# Patient Record
Sex: Male | Born: 1980 | Race: Black or African American | Hispanic: No | Marital: Married | Smoking: Former smoker
Health system: Southern US, Community
[De-identification: ages and names within clinical notes are randomized; demographics above are authoritative.]

## PROBLEM LIST (undated history)

## (undated) DIAGNOSIS — R45851 Suicidal ideations: Secondary | ICD-10-CM

## (undated) DIAGNOSIS — K8020 Calculus of gallbladder without cholecystitis without obstruction: Secondary | ICD-10-CM

## (undated) DIAGNOSIS — M549 Dorsalgia, unspecified: Secondary | ICD-10-CM

## (undated) DIAGNOSIS — F329 Major depressive disorder, single episode, unspecified: Secondary | ICD-10-CM

## (undated) DIAGNOSIS — S0990XA Unspecified injury of head, initial encounter: Secondary | ICD-10-CM

## (undated) DIAGNOSIS — F32A Depression, unspecified: Secondary | ICD-10-CM

## (undated) DIAGNOSIS — M542 Cervicalgia: Secondary | ICD-10-CM

## (undated) HISTORY — PX: WISDOM TOOTH EXTRACTION: SHX21

## (undated) HISTORY — DX: Unspecified injury of head, initial encounter: S09.90XA

## (undated) HISTORY — DX: Cervicalgia: M54.2

---

## 2000-11-20 ENCOUNTER — Emergency Department (HOSPITAL_COMMUNITY): Admission: EM | Admit: 2000-11-20 | Discharge: 2000-11-20 | Payer: Self-pay | Admitting: Emergency Medicine

## 2002-01-17 ENCOUNTER — Emergency Department (HOSPITAL_COMMUNITY): Admission: EM | Admit: 2002-01-17 | Discharge: 2002-01-17 | Payer: Self-pay | Admitting: Emergency Medicine

## 2002-02-14 ENCOUNTER — Emergency Department (HOSPITAL_COMMUNITY): Admission: EM | Admit: 2002-02-14 | Discharge: 2002-02-15 | Payer: Self-pay | Admitting: Emergency Medicine

## 2002-02-15 ENCOUNTER — Encounter: Payer: Self-pay | Admitting: Emergency Medicine

## 2002-07-02 ENCOUNTER — Emergency Department (HOSPITAL_COMMUNITY): Admission: EM | Admit: 2002-07-02 | Discharge: 2002-07-02 | Payer: Self-pay | Admitting: Emergency Medicine

## 2002-07-02 ENCOUNTER — Encounter: Payer: Self-pay | Admitting: Emergency Medicine

## 2003-05-10 ENCOUNTER — Emergency Department (HOSPITAL_COMMUNITY): Admission: EM | Admit: 2003-05-10 | Discharge: 2003-05-10 | Payer: Self-pay | Admitting: Emergency Medicine

## 2003-09-03 ENCOUNTER — Emergency Department (HOSPITAL_COMMUNITY): Admission: EM | Admit: 2003-09-03 | Discharge: 2003-09-03 | Payer: Self-pay | Admitting: Emergency Medicine

## 2003-12-28 ENCOUNTER — Emergency Department (HOSPITAL_COMMUNITY): Admission: EM | Admit: 2003-12-28 | Discharge: 2003-12-28 | Payer: Self-pay | Admitting: Emergency Medicine

## 2004-10-02 ENCOUNTER — Emergency Department (HOSPITAL_COMMUNITY): Admission: EM | Admit: 2004-10-02 | Discharge: 2004-10-03 | Payer: Self-pay | Admitting: Emergency Medicine

## 2009-02-21 ENCOUNTER — Emergency Department (HOSPITAL_COMMUNITY): Admission: EM | Admit: 2009-02-21 | Discharge: 2009-02-21 | Payer: Self-pay | Admitting: Emergency Medicine

## 2009-11-09 ENCOUNTER — Inpatient Hospital Stay (HOSPITAL_COMMUNITY): Admission: EM | Admit: 2009-11-09 | Discharge: 2009-11-23 | Payer: Self-pay | Admitting: Emergency Medicine

## 2009-11-12 ENCOUNTER — Ambulatory Visit: Payer: Self-pay | Admitting: Physical Medicine & Rehabilitation

## 2009-12-26 ENCOUNTER — Encounter: Admission: RE | Admit: 2009-12-26 | Discharge: 2009-12-26 | Payer: Self-pay | Admitting: Neurology

## 2010-01-09 ENCOUNTER — Encounter: Admission: RE | Admit: 2010-01-09 | Discharge: 2010-01-09 | Payer: Self-pay | Admitting: Neurology

## 2010-02-20 ENCOUNTER — Encounter: Admission: RE | Admit: 2010-02-20 | Discharge: 2010-04-15 | Payer: Self-pay | Admitting: Neurology

## 2010-03-13 ENCOUNTER — Encounter
Admission: RE | Admit: 2010-03-13 | Discharge: 2010-06-11 | Payer: Self-pay | Admitting: Physical Medicine & Rehabilitation

## 2010-03-19 ENCOUNTER — Ambulatory Visit: Payer: Self-pay | Admitting: Physical Medicine & Rehabilitation

## 2010-04-22 ENCOUNTER — Ambulatory Visit: Payer: Self-pay | Admitting: Physical Medicine & Rehabilitation

## 2010-05-23 ENCOUNTER — Ambulatory Visit: Payer: Self-pay | Admitting: Physical Medicine & Rehabilitation

## 2010-07-12 ENCOUNTER — Encounter
Admission: RE | Admit: 2010-07-12 | Discharge: 2010-09-24 | Payer: Self-pay | Source: Home / Self Care | Attending: Physical Medicine & Rehabilitation | Admitting: Physical Medicine & Rehabilitation

## 2010-09-15 ENCOUNTER — Encounter: Payer: Self-pay | Admitting: Emergency Medicine

## 2010-09-30 ENCOUNTER — Observation Stay (HOSPITAL_COMMUNITY)
Admission: EM | Admit: 2010-09-30 | Discharge: 2010-10-01 | Disposition: A | Discharge status: Home / Self Care | Payer: Self-pay | Attending: Emergency Medicine | Admitting: Emergency Medicine

## 2010-09-30 ENCOUNTER — Observation Stay (HOSPITAL_COMMUNITY): Payer: Self-pay

## 2010-09-30 ENCOUNTER — Emergency Department (HOSPITAL_COMMUNITY): Payer: Self-pay

## 2010-09-30 DIAGNOSIS — R197 Diarrhea, unspecified: Secondary | ICD-10-CM | POA: Insufficient documentation

## 2010-09-30 DIAGNOSIS — N4403 Torsion of appendix testis: Secondary | ICD-10-CM | POA: Insufficient documentation

## 2010-09-30 DIAGNOSIS — R109 Unspecified abdominal pain: Secondary | ICD-10-CM | POA: Insufficient documentation

## 2010-09-30 DIAGNOSIS — K5289 Other specified noninfective gastroenteritis and colitis: Principal | ICD-10-CM | POA: Insufficient documentation

## 2010-09-30 DIAGNOSIS — R112 Nausea with vomiting, unspecified: Secondary | ICD-10-CM | POA: Insufficient documentation

## 2010-09-30 DIAGNOSIS — R82998 Other abnormal findings in urine: Secondary | ICD-10-CM | POA: Insufficient documentation

## 2010-09-30 LAB — GLUCOSE, CAPILLARY: Glucose-Capillary: 117 mg/dL — ABNORMAL HIGH (ref 70–99)

## 2010-09-30 LAB — CBC
HCT: 45.9 % (ref 39.0–52.0)
MCHC: 34.4 g/dL (ref 30.0–36.0)
Platelets: 234 10*3/uL (ref 150–400)

## 2010-09-30 LAB — RAPID URINE DRUG SCREEN, HOSP PERFORMED
Amphetamines: NOT DETECTED
Benzodiazepines: NOT DETECTED
Opiates: POSITIVE — AB

## 2010-09-30 LAB — COMPREHENSIVE METABOLIC PANEL
ALT: 22 U/L (ref 0–53)
Albumin: 4.1 g/dL (ref 3.5–5.2)
BUN: 13 mg/dL (ref 6–23)
CO2: 21 mEq/L (ref 19–32)
Calcium: 9.5 mg/dL (ref 8.4–10.5)
Chloride: 107 mEq/L (ref 96–112)
Creatinine, Ser: 1.25 mg/dL (ref 0.4–1.5)
GFR calc Af Amer: >60 mL/min (ref >60)
GFR calc non Af Amer: >60 mL/min (ref >60)
Potassium: 4.5 mEq/L (ref 3.5–5.1)
Sodium: 142 mEq/L (ref 135–145)
Total Protein: 7.8 g/dL (ref 6.0–8.3)

## 2010-09-30 LAB — CLOSTRIDIUM DIFFICILE BY PCR: Toxigenic C. Difficile by PCR: NEGATIVE

## 2010-09-30 LAB — URINALYSIS, ROUTINE W REFLEX MICROSCOPIC
Specific Gravity, Urine: 1.026 (ref 1.005–1.030)
Urobilinogen, UA: 1 mg/dL (ref 0.0–1.0)

## 2010-09-30 LAB — URINE MICROSCOPIC-ADD ON

## 2010-10-01 ENCOUNTER — Encounter (HOSPITAL_COMMUNITY): Payer: Self-pay | Admitting: Radiology

## 2010-10-01 LAB — GIARDIA/CRYPTOSPORIDIUM SCREEN(EIA): Giardia Screen - EIA: NEGATIVE

## 2010-10-01 MED ORDER — IOHEXOL 300 MG/ML  SOLN: 100.0000 mL | Freq: Once | INTRAMUSCULAR | Status: DC | PRN

## 2010-10-04 LAB — STOOL CULTURE

## 2010-10-18 ENCOUNTER — Emergency Department (HOSPITAL_COMMUNITY)
Admission: EM | Admit: 2010-10-18 | Discharge: 2010-10-18 | Disposition: A | Discharge status: Home / Self Care | Payer: Self-pay | Attending: Emergency Medicine | Admitting: Emergency Medicine

## 2010-10-18 ENCOUNTER — Emergency Department (HOSPITAL_COMMUNITY): Payer: Self-pay

## 2010-10-18 DIAGNOSIS — R079 Chest pain, unspecified: Secondary | ICD-10-CM | POA: Insufficient documentation

## 2010-10-18 DIAGNOSIS — R0602 Shortness of breath: Secondary | ICD-10-CM | POA: Insufficient documentation

## 2010-10-18 DIAGNOSIS — R51 Headache: Secondary | ICD-10-CM | POA: Insufficient documentation

## 2010-10-18 DIAGNOSIS — R509 Fever, unspecified: Secondary | ICD-10-CM | POA: Insufficient documentation

## 2010-10-18 DIAGNOSIS — Y929 Unspecified place or not applicable: Secondary | ICD-10-CM | POA: Insufficient documentation

## 2010-10-18 DIAGNOSIS — T391X1A Poisoning by 4-Aminophenol derivatives, accidental (unintentional), initial encounter: Secondary | ICD-10-CM | POA: Insufficient documentation

## 2010-10-18 DIAGNOSIS — R5381 Other malaise: Secondary | ICD-10-CM | POA: Insufficient documentation

## 2010-10-18 LAB — CBC
HCT: 44.2 % (ref 39.0–52.0)
Hemoglobin: 15.1 g/dL (ref 13.0–17.0)
MCH: 28.9 pg (ref 26.0–34.0)
MCV: 84.7 fL (ref 78.0–100.0)
RBC: 5.22 MIL/uL (ref 4.22–5.81)

## 2010-10-18 LAB — RAPID URINE DRUG SCREEN, HOSP PERFORMED
Amphetamines: NOT DETECTED
Barbiturates: NOT DETECTED
Benzodiazepines: NOT DETECTED
Opiates: POSITIVE — AB

## 2010-10-18 LAB — POCT I-STAT 3, ART BLOOD GAS (G3+)
Bicarbonate: 23.5 mEq/L (ref 20.0–24.0)
O2 Saturation: 95 %
TCO2: 25 mmol/L (ref 0–100)
pCO2 arterial: 37.4 mmHg (ref 35.0–45.0)

## 2010-10-18 LAB — URINE MICROSCOPIC-ADD ON

## 2010-10-18 LAB — COMPREHENSIVE METABOLIC PANEL
ALT: 25 U/L (ref 0–53)
AST: 22 U/L (ref 0–37)
Calcium: 9.3 mg/dL (ref 8.4–10.5)
Creatinine, Ser: 0.97 mg/dL (ref 0.4–1.5)
GFR calc Af Amer: >60 mL/min (ref >60)
GFR calc non Af Amer: >60 mL/min (ref >60)
Sodium: 137 mEq/L (ref 135–145)
Total Protein: 7.6 g/dL (ref 6.0–8.3)

## 2010-10-18 LAB — DIFFERENTIAL
Eosinophils Absolute: 0.1 10*3/uL (ref 0.0–0.7)
Lymphocytes Relative: 6 % — ABNORMAL LOW (ref 12–46)
Lymphs Abs: 0.5 10*3/uL — ABNORMAL LOW (ref 0.7–4.0)
Monocytes Relative: 12 % (ref 3–12)
Neutro Abs: 6.8 10*3/uL (ref 1.7–7.7)
Neutrophils Relative %: 80 % — ABNORMAL HIGH (ref 43–77)

## 2010-10-18 LAB — URINALYSIS, ROUTINE W REFLEX MICROSCOPIC
Bilirubin Urine: NEGATIVE
Hgb urine dipstick: NEGATIVE
Ketones, ur: 15 mg/dL — AB
Specific Gravity, Urine: 1.021 (ref 1.005–1.030)
Urine Glucose, Fasting: NEGATIVE mg/dL
pH: 7.5 (ref 5.0–8.0)

## 2010-10-18 LAB — LACTIC ACID, PLASMA: Lactic Acid, Venous: 1.4 mmol/L (ref 0.5–2.2)

## 2010-10-18 LAB — ETHANOL: Alcohol, Ethyl (B): <5 mg/dL (ref 0–10)

## 2010-10-24 LAB — CULTURE, BLOOD (ROUTINE X 2): Culture  Setup Time: 201202241106

## 2010-11-18 LAB — HTLV I+II ANTIBODIES, (EIA), BLD: HTLV I/II Ab: NONREACTIVE

## 2010-11-18 LAB — DIFFERENTIAL
Eosinophils Absolute: 0.2 10*3/uL (ref 0.0–0.7)
Eosinophils Relative: 4 % (ref 0–5)
Lymphocytes Relative: 32 % (ref 12–46)
Lymphs Abs: 2 10*3/uL (ref 0.7–4.0)
Monocytes Absolute: 0.5 10*3/uL (ref 0.1–1.0)
Monocytes Relative: 8 % (ref 3–12)

## 2010-11-18 LAB — HSV PCR
HSV 2 , PCR: NOT DETECTED
HSV 2 , PCR: NOT DETECTED
HSV, PCR: NOT DETECTED
HSV, PCR: NOT DETECTED
Specimen Source-HSVPCR: NO GROWTH

## 2010-11-18 LAB — RPR: RPR Ser Ql: NONREACTIVE

## 2010-11-18 LAB — COMPREHENSIVE METABOLIC PANEL
ALT: 54 U/L — ABNORMAL HIGH (ref 0–53)
AST: 37 U/L (ref 0–37)
Albumin: 3.6 g/dL (ref 3.5–5.2)
CO2: 28 mEq/L (ref 19–32)
Calcium: 9.1 mg/dL (ref 8.4–10.5)
Chloride: 103 mEq/L (ref 96–112)
GFR calc Af Amer: >60 mL/min (ref >60)
GFR calc non Af Amer: >60 mL/min (ref >60)
Sodium: 137 mEq/L (ref 135–145)

## 2010-11-18 LAB — CSF CELL COUNT WITH DIFFERENTIAL
Lymphs, CSF: 97 % — ABNORMAL HIGH (ref 40–80)
Lymphs, CSF: 98 % — ABNORMAL HIGH (ref 40–80)
Monocyte-Macrophage-Spinal Fluid: 2 % — ABNORMAL LOW (ref 15–45)
Monocyte-Macrophage-Spinal Fluid: 3 % — ABNORMAL LOW (ref 15–45)

## 2010-11-18 LAB — ANGIOTENSIN CONVERTING ENZYME: Angiotensin-Converting Enzyme: 18 U/L (ref 9–67)

## 2010-11-18 LAB — AFB CULTURE WITH SMEAR (NOT AT ARMC)

## 2010-11-18 LAB — HIV ANTIBODY (ROUTINE TESTING W REFLEX): HIV: NONREACTIVE

## 2010-11-18 LAB — CBC
MCHC: 33.4 g/dL (ref 30.0–36.0)
Platelets: 213 10*3/uL (ref 150–400)
RBC: 5.05 MIL/uL (ref 4.22–5.81)
WBC: 6.2 10*3/uL (ref 4.0–10.5)

## 2010-11-18 LAB — EPSTEIN BARR VRS(EBV DNA BY PCR)
EBV DNA QN by PCR: <200 copies/mL (ref <200)
EBV DNA QN by PCR: <200 copies/mL (ref <200)

## 2010-11-18 LAB — PROTEIN AND GLUCOSE, CSF
Glucose, CSF: 53 mg/dL (ref 43–76)
Total  Protein, CSF: 74 mg/dL — ABNORMAL HIGH (ref 15–45)

## 2010-11-18 LAB — OLIGOCLONAL BANDS, CSF + SERM

## 2010-11-18 LAB — CSF CULTURE W GRAM STAIN

## 2010-11-18 LAB — ARBOVIRUS PANEL, ~~LOC~~ LAB

## 2011-07-15 ENCOUNTER — Ambulatory Visit: Payer: Self-pay | Admitting: Physical Medicine & Rehabilitation

## 2011-07-15 ENCOUNTER — Encounter: Payer: 59 | Attending: Physical Medicine & Rehabilitation

## 2011-07-15 DIAGNOSIS — F329 Major depressive disorder, single episode, unspecified: Secondary | ICD-10-CM | POA: Insufficient documentation

## 2011-07-15 DIAGNOSIS — R279 Unspecified lack of coordination: Secondary | ICD-10-CM | POA: Insufficient documentation

## 2011-07-15 DIAGNOSIS — M62838 Other muscle spasm: Secondary | ICD-10-CM | POA: Insufficient documentation

## 2011-07-15 DIAGNOSIS — R42 Dizziness and giddiness: Secondary | ICD-10-CM | POA: Insufficient documentation

## 2011-07-15 DIAGNOSIS — M47817 Spondylosis without myelopathy or radiculopathy, lumbosacral region: Secondary | ICD-10-CM | POA: Insufficient documentation

## 2011-07-15 DIAGNOSIS — M79609 Pain in unspecified limb: Secondary | ICD-10-CM | POA: Insufficient documentation

## 2011-07-15 DIAGNOSIS — IMO0002 Reserved for concepts with insufficient information to code with codable children: Secondary | ICD-10-CM | POA: Insufficient documentation

## 2011-07-15 DIAGNOSIS — M545 Low back pain, unspecified: Secondary | ICD-10-CM | POA: Insufficient documentation

## 2011-07-15 DIAGNOSIS — M75100 Unspecified rotator cuff tear or rupture of unspecified shoulder, not specified as traumatic: Secondary | ICD-10-CM | POA: Insufficient documentation

## 2011-07-15 DIAGNOSIS — F411 Generalized anxiety disorder: Secondary | ICD-10-CM | POA: Insufficient documentation

## 2011-07-15 DIAGNOSIS — R109 Unspecified abdominal pain: Secondary | ICD-10-CM | POA: Insufficient documentation

## 2011-07-15 DIAGNOSIS — M25519 Pain in unspecified shoulder: Secondary | ICD-10-CM | POA: Insufficient documentation

## 2011-07-15 DIAGNOSIS — F3289 Other specified depressive episodes: Secondary | ICD-10-CM | POA: Insufficient documentation

## 2011-07-15 DIAGNOSIS — M719 Bursopathy, unspecified: Secondary | ICD-10-CM

## 2011-07-15 DIAGNOSIS — R110 Nausea: Secondary | ICD-10-CM | POA: Insufficient documentation

## 2011-07-15 DIAGNOSIS — M67919 Unspecified disorder of synovium and tendon, unspecified shoulder: Secondary | ICD-10-CM

## 2011-07-15 DIAGNOSIS — R209 Unspecified disturbances of skin sensation: Secondary | ICD-10-CM | POA: Insufficient documentation

## 2011-07-15 NOTE — Assessment & Plan Note (Signed)
HISTORY:  A 30 year old male, lifting injury November 09, 2009, moving boxes for his wife in a [employer], onset of pain radiating to the lower extremity.  He no longer has lower extremity radiating pain.  He has been seen by Psychiatry in the past for depressive disorder.  In regards to his back, a repeat MRI showed no significant nerve root encroachment.  Some bulging L4-5 disk.  He was trialed with medial branch blocks, performed April 22, 2010 left side at L2, 3, 4 levels. This was repeated May 23, 2010 at same levels, left L2, 3, 4.  He once again got good relief, 80% plus last couple months.  The patient then lost his insurance.  He has insurance again.  Pain level about 8/10.  He also has some shoulder pain.  He wants me to evaluate.  He has some pain that radiates upward from his low back.  He can walk 20-30 minutes at a time.  He is able to climb steps.  He is able to drive.  He works 40 hours a week at [employer].  REVIEW OF SYSTEMS:  Numbness, tingling.  She states are in his hands at times.  Trouble walking, spasms, dizziness, depression, anxiety, abdominal pain, nausea.  FAMILY HISTORY:  Diabetes.  SOCIAL HISTORY:  Married.  Alcohol use on weekends, smokes about 4 cigarettes per day.  OBJECTIVE:  VITAL SIGNS:  Blood pressure 163/91, pulse 85, respirations 18 and O2 saturation 95% room air.  GENERAL:  No acute distress.  Mood and affect appropriate. BACK:  His back has tenderness to palpation along the lumbar paraspinals on the left side mainly his pain is more when he leans left or on his backward more so than leaning forward.  Straight leg raising test is negative.  His right shoulder has positive impingement sign.  Neck has full range of motion without pain.  Upper extremity and lower extremity strength are normal.  IMPRESSION: 1. Lumbar spondylosis.  I think he would be a good candidate for     radiofrequency neurotomy given he has had a positive response  x2     with medial branch blocks corresponding levels. 2. Right shoulder pain subacromial bursitis.  He has no obvious risk     factors.  We discussed his activities.  Does not do a lot of     overhead activity.  Nevertheless, we will start him on Mobic for     about 10-14 days, 50 mg. 3. Reinstitute tramadol and he says that it was helpful for him. 4. This will likely need to go through some physical therapy after we     finish with his radiofrequency working out only on his back, but     also in his shoulder and neck area, upper back area.  I discussed     with the patient and wife, agree with plan.     Erick Colace, M.D. Electronically Signed    AEK/MedQ D:  07/15/2011 16:50:28  T:  07/15/2011 21:29:17  Job #:  [id number]

## 2011-08-11 ENCOUNTER — Encounter: Payer: 59 | Admitting: Physical Medicine & Rehabilitation

## 2011-08-11 ENCOUNTER — Encounter: Payer: 59 | Attending: Physical Medicine & Rehabilitation

## 2011-08-11 DIAGNOSIS — M47817 Spondylosis without myelopathy or radiculopathy, lumbosacral region: Secondary | ICD-10-CM

## 2011-08-11 DIAGNOSIS — M545 Low back pain, unspecified: Secondary | ICD-10-CM | POA: Insufficient documentation

## 2011-08-11 DIAGNOSIS — R279 Unspecified lack of coordination: Secondary | ICD-10-CM | POA: Insufficient documentation

## 2011-08-11 DIAGNOSIS — IMO0002 Reserved for concepts with insufficient information to code with codable children: Secondary | ICD-10-CM | POA: Insufficient documentation

## 2011-08-11 DIAGNOSIS — F3289 Other specified depressive episodes: Secondary | ICD-10-CM | POA: Insufficient documentation

## 2011-08-11 DIAGNOSIS — R42 Dizziness and giddiness: Secondary | ICD-10-CM | POA: Insufficient documentation

## 2011-08-11 DIAGNOSIS — R110 Nausea: Secondary | ICD-10-CM | POA: Insufficient documentation

## 2011-08-11 DIAGNOSIS — R109 Unspecified abdominal pain: Secondary | ICD-10-CM | POA: Insufficient documentation

## 2011-08-11 DIAGNOSIS — M75100 Unspecified rotator cuff tear or rupture of unspecified shoulder, not specified as traumatic: Secondary | ICD-10-CM | POA: Insufficient documentation

## 2011-08-11 DIAGNOSIS — R209 Unspecified disturbances of skin sensation: Secondary | ICD-10-CM | POA: Insufficient documentation

## 2011-08-11 DIAGNOSIS — F411 Generalized anxiety disorder: Secondary | ICD-10-CM | POA: Insufficient documentation

## 2011-08-11 DIAGNOSIS — M62838 Other muscle spasm: Secondary | ICD-10-CM | POA: Insufficient documentation

## 2011-08-11 DIAGNOSIS — F329 Major depressive disorder, single episode, unspecified: Secondary | ICD-10-CM | POA: Insufficient documentation

## 2011-08-11 DIAGNOSIS — M25519 Pain in unspecified shoulder: Secondary | ICD-10-CM | POA: Insufficient documentation

## 2011-08-11 DIAGNOSIS — M79609 Pain in unspecified limb: Secondary | ICD-10-CM | POA: Insufficient documentation

## 2011-08-11 NOTE — Procedures (Signed)
Mark Rice, Mark Rice              ACCOUNT NO.:  [id number]  MEDICAL RECORD NO.:  [id number]           PATIENT TYPE:  O  LOCATION:  [facility]                          FACILITY:  [facility]  PHYSICIAN:  Erick Colace, M.D.DATE OF BIRTH:  1980/12/19  DATE OF PROCEDURE:  08/11/2011 DATE OF DISCHARGE:                              OPERATIVE REPORT  PROCEDURE:  Left L2, left L3, left L4 medial branch radiofrequency neurotomy.  INDICATION:  Lumbar pain that responded at a 75% level to medial branch blocks x2 at corresponding levels.  Pain is only partially responsive to medication management and other conservative care.  Informed consent was obtained after describing risks and benefits of the procedure with the patient.  These include bleeding, bruising, and infection.  He elects to proceed and has given written consent.  The patient placed prone on fluoroscopy table.  Betadine prep, sterile drape, 25-gauge inch and half needle was used to anesthetize the skin and subcutaneous tissue with 1% lidocaine x2 mL.  Then, an 18-gauge 15 cm RF needle was inserted under fluoroscopic guidance first charting left L5 SAP transverse process junction, bone contact made, confirmed with lateral imaging.  Sensory stim at 50 Hz followed by motor stim at 2 Hz, confirmed proper needle location, followed by injection of 1 mL dexamethasone lidocaine solution which consists of 1 mL of 4 mg/mL dexamethasone, 2 mL of 1% lidocaine followed by radiofrequency lesioning 70 degrees Celsius for 70 seconds.  Then, the left L4 SAP transverse process junction targeted, bone contact made confirmed with lateral imaging.  Sensory stim at 50 Hz followed by motor stim at 2 Hz, confirmed proper needle location followed by injection of 1 mL of dexamethasone lidocaine solution and radiofrequency lesioning 70 degrees Celsius for 70 seconds.  Then, the left L3 SAP transverse process junction targeted, bone contact made, confirmed with  lateral imaging. Sensory stim at 50 Hz followed by motor stim at 2 Hz confirmed proper needle location followed by injection of 1 mL of the dexamethasone lidocaine solution and radiofrequency lesioning 70 degrees Celsius for 70 seconds.  The patient tolerated procedure well.  Pre and post injection vitals stable.  Post injection instructions given.     Erick Colace, M.D. Electronically Signed    AEK/MEDQ  D:  08/11/2011 14:48:34  T:  08/11/2011 19:48:32  Job:  [id number]

## 2011-09-11 ENCOUNTER — Encounter: Payer: 59 | Attending: Physical Medicine & Rehabilitation

## 2011-09-11 ENCOUNTER — Ambulatory Visit: Payer: 59 | Admitting: Physical Medicine & Rehabilitation

## 2011-09-11 DIAGNOSIS — IMO0002 Reserved for concepts with insufficient information to code with codable children: Secondary | ICD-10-CM | POA: Insufficient documentation

## 2011-09-11 DIAGNOSIS — M47817 Spondylosis without myelopathy or radiculopathy, lumbosacral region: Secondary | ICD-10-CM

## 2011-09-11 DIAGNOSIS — R42 Dizziness and giddiness: Secondary | ICD-10-CM | POA: Insufficient documentation

## 2011-09-11 DIAGNOSIS — M545 Low back pain, unspecified: Secondary | ICD-10-CM | POA: Insufficient documentation

## 2011-09-11 DIAGNOSIS — F329 Major depressive disorder, single episode, unspecified: Secondary | ICD-10-CM | POA: Insufficient documentation

## 2011-09-11 DIAGNOSIS — R209 Unspecified disturbances of skin sensation: Secondary | ICD-10-CM | POA: Insufficient documentation

## 2011-09-11 DIAGNOSIS — R110 Nausea: Secondary | ICD-10-CM | POA: Insufficient documentation

## 2011-09-11 DIAGNOSIS — M62838 Other muscle spasm: Secondary | ICD-10-CM | POA: Insufficient documentation

## 2011-09-11 DIAGNOSIS — R279 Unspecified lack of coordination: Secondary | ICD-10-CM | POA: Insufficient documentation

## 2011-09-11 DIAGNOSIS — M75100 Unspecified rotator cuff tear or rupture of unspecified shoulder, not specified as traumatic: Secondary | ICD-10-CM | POA: Insufficient documentation

## 2011-09-11 DIAGNOSIS — F3289 Other specified depressive episodes: Secondary | ICD-10-CM | POA: Insufficient documentation

## 2011-09-11 DIAGNOSIS — M25519 Pain in unspecified shoulder: Secondary | ICD-10-CM | POA: Insufficient documentation

## 2011-09-11 DIAGNOSIS — R109 Unspecified abdominal pain: Secondary | ICD-10-CM | POA: Insufficient documentation

## 2011-09-11 DIAGNOSIS — F411 Generalized anxiety disorder: Secondary | ICD-10-CM | POA: Insufficient documentation

## 2011-09-11 DIAGNOSIS — M79609 Pain in unspecified limb: Secondary | ICD-10-CM | POA: Insufficient documentation

## 2011-09-11 NOTE — Assessment & Plan Note (Signed)
A 31 year old male who underwent left L2, L3, L4 medial branch radiofrequency neurotomy under fluoroscopic guidance performed on August 11, 2011.  He states that his pain on that left side is basically gone.  He only feels pain on the right side.  He rates his right-sided pain at 6-7/10.  Described as sharp, stabbing and aching. Worsened with walking, bending, sitting and standing.  Interfering with sleep, improving with TENS, injections and medication.  However, still causing some problems with his functioning and his Oswestry score is 48.  REVIEW OF SYSTEMS:  Otherwise negative.  PHYSICAL EXAMINATION:  GENERAL:  No acute distress. Mood and affect are appropriate. VITAL SIGNS:  Blood pressure 134/77, pulse 78, respiratory rate is 18 and O2 sat 92% on room air.  Weight 371 pounds, height 5 feet 11 inches. EXTREMITIES:  He has no tenderness to palpation in his lumbar spine.  No evidence of redness in the skin.  His forward flexion and extension are good.  His lateral bending toward the left was normal, to the right was painful.  His gait was normal.  IMPRESSION:  Lumbar spondylosis mainly L2-3, L3-4 facet joint complex.  PLAN:  We will do the radiofrequency procedure targeting medial branches L2, 3, 4.  He has had bilateral medial branch blocks performed in the past which were helpful.  Discussed with the patient, agrees with plan.     Erick Colace, M.D. Electronically Signed    AEK/MedQ D:  09/11/2011 10:32:46  T:  09/11/2011 17:39:03  Job #:  [id number]

## 2011-10-02 ENCOUNTER — Encounter: Payer: 59 | Attending: Physical Medicine & Rehabilitation

## 2011-10-02 ENCOUNTER — Encounter: Payer: 59 | Admitting: Physical Medicine & Rehabilitation

## 2011-10-02 DIAGNOSIS — M545 Low back pain, unspecified: Secondary | ICD-10-CM | POA: Insufficient documentation

## 2011-10-02 DIAGNOSIS — R279 Unspecified lack of coordination: Secondary | ICD-10-CM | POA: Insufficient documentation

## 2011-10-02 DIAGNOSIS — IMO0002 Reserved for concepts with insufficient information to code with codable children: Secondary | ICD-10-CM | POA: Insufficient documentation

## 2011-10-02 DIAGNOSIS — M47817 Spondylosis without myelopathy or radiculopathy, lumbosacral region: Secondary | ICD-10-CM | POA: Insufficient documentation

## 2011-10-02 DIAGNOSIS — R109 Unspecified abdominal pain: Secondary | ICD-10-CM | POA: Insufficient documentation

## 2011-10-02 DIAGNOSIS — F3289 Other specified depressive episodes: Secondary | ICD-10-CM | POA: Insufficient documentation

## 2011-10-02 DIAGNOSIS — M75100 Unspecified rotator cuff tear or rupture of unspecified shoulder, not specified as traumatic: Secondary | ICD-10-CM | POA: Insufficient documentation

## 2011-10-02 DIAGNOSIS — M25519 Pain in unspecified shoulder: Secondary | ICD-10-CM | POA: Insufficient documentation

## 2011-10-02 DIAGNOSIS — R110 Nausea: Secondary | ICD-10-CM | POA: Insufficient documentation

## 2011-10-02 DIAGNOSIS — M62838 Other muscle spasm: Secondary | ICD-10-CM | POA: Insufficient documentation

## 2011-10-02 DIAGNOSIS — R42 Dizziness and giddiness: Secondary | ICD-10-CM | POA: Insufficient documentation

## 2011-10-02 DIAGNOSIS — M79609 Pain in unspecified limb: Secondary | ICD-10-CM | POA: Insufficient documentation

## 2011-10-02 DIAGNOSIS — F411 Generalized anxiety disorder: Secondary | ICD-10-CM | POA: Insufficient documentation

## 2011-10-02 DIAGNOSIS — R209 Unspecified disturbances of skin sensation: Secondary | ICD-10-CM | POA: Insufficient documentation

## 2011-10-02 DIAGNOSIS — F329 Major depressive disorder, single episode, unspecified: Secondary | ICD-10-CM | POA: Insufficient documentation

## 2011-10-02 NOTE — Procedures (Signed)
Mark Rice, Mark Rice              ACCOUNT NO.:  [id number]  MEDICAL RECORD NO.:  [id number]           PATIENT TYPE:  O  LOCATION:  [facility]                          FACILITY:  [facility]  PHYSICIAN:  Erick Colace, M.D.DATE OF BIRTH:  08/24/1981  DATE OF PROCEDURE: DATE OF DISCHARGE:                              OPERATIVE REPORT  A 31 year old male, who had good relief with medial branch blocks, L2, 3, 4 medial branches, bilaterally, he has undergone a left L2, 3, 4 medial branch radiofrequency neurotomy on August 11, 2011, with good relief.  Now the pain on left side has gone, but he feels pain on the right side, rated 6 to 7/10.  He is here for RF.  Informed consent was obtained after describing risks and benefits of the procedure with the patient.  These include bleeding, bruising, and infection.  He elects to proceed and has given written consent.  The patient placed prone on fluoroscopy table.  Betadine prep, sterile drape, a 25-gauge inch and half needle was used to anesthetize skin and subcu tissue, 1% lidocaine x2 mL.  Then, an 18-gauge 15-cm RF needle with 10 mm curved active tip was inserted under fluoroscopic guidance, first starting right L5 SAP transverse process junction, bone contact made, confirmed with lateral imaging.  Sensory stim at 50 Hz followed by motor stim at 2 Hz, confirmed proper needle location, followed by injection of 1 mL of solution containing 1 mL of 4 mg/mL dexamethasone, and 3 mL of 1% MPF lidocaine followed by radiofrequency lesioning 70 degrees Celsius for 70 seconds.  Then, the right L4 SAP transverse process junction targeted, bone contact made, confirmed with lateral imaging.  Sensory stim at 50 Hz followed by motor stim at 2 Hz, confirmed proper needle location, followed by injection of 1 mL of the dexamethasone lidocaine solution and radiofrequency lesioning 70 degrees Celsius for 70 seconds.  Then the right L3 SAP transverse process junction  targeted, bone contact made confirmed with lateral imaging. Sensory stim at 50 Hz followed by motor stim at 2 Hz, confirmed proper needle location, followed by injection of 1 mL of the dexamethasone lidocaine solution and radiofrequency lesioning 70 degrees Celsius for 70 seconds.  The patient tolerated procedure well.  Pre and post injection vitals stable.  Post injection instructions given.  I will see him back in 4 months or sooner if he has any complications.  He is to take gabapentin 1800 mg at night.     Erick Colace, M.D. Electronically Signed    AEK/MEDQ  D:  10/02/2011 12:38:20  T:  10/02/2011 20:42:01  Job:  [id number]

## 2011-11-17 ENCOUNTER — Emergency Department (HOSPITAL_COMMUNITY): Payer: 59

## 2011-11-17 ENCOUNTER — Emergency Department (HOSPITAL_COMMUNITY)
Admission: EM | Admit: 2011-11-17 | Discharge: 2011-11-17 | Disposition: A | Discharge status: Home / Self Care | Payer: 59 | Attending: Emergency Medicine | Admitting: Emergency Medicine

## 2011-11-17 ENCOUNTER — Encounter (HOSPITAL_COMMUNITY): Payer: Self-pay | Admitting: *Deleted

## 2011-11-17 DIAGNOSIS — R1011 Right upper quadrant pain: Secondary | ICD-10-CM | POA: Insufficient documentation

## 2011-11-17 DIAGNOSIS — K8020 Calculus of gallbladder without cholecystitis without obstruction: Secondary | ICD-10-CM | POA: Insufficient documentation

## 2011-11-17 DIAGNOSIS — R300 Dysuria: Secondary | ICD-10-CM | POA: Insufficient documentation

## 2011-11-17 DIAGNOSIS — R1013 Epigastric pain: Secondary | ICD-10-CM | POA: Insufficient documentation

## 2011-11-17 DIAGNOSIS — R1031 Right lower quadrant pain: Secondary | ICD-10-CM | POA: Insufficient documentation

## 2011-11-17 HISTORY — DX: Calculus of gallbladder without cholecystitis without obstruction: K80.20

## 2011-11-17 LAB — URINE MICROSCOPIC-ADD ON

## 2011-11-17 LAB — COMPREHENSIVE METABOLIC PANEL
AST: 19 U/L (ref 0–37)
BUN: 8 mg/dL (ref 6–23)
CO2: 26 mEq/L (ref 19–32)
Calcium: 9.1 mg/dL (ref 8.4–10.5)
Chloride: 101 mEq/L (ref 96–112)
Creatinine, Ser: 0.84 mg/dL (ref 0.50–1.35)
GFR calc Af Amer: >90 mL/min (ref >90)
GFR calc non Af Amer: >90 mL/min (ref >90)
Glucose, Bld: 100 mg/dL — ABNORMAL HIGH (ref 70–99)
Total Bilirubin: 0.1 mg/dL — ABNORMAL LOW (ref 0.3–1.2)

## 2011-11-17 LAB — CBC
Hemoglobin: 13.8 g/dL (ref 13.0–17.0)
MCH: 28 pg (ref 26.0–34.0)
RBC: 4.93 MIL/uL (ref 4.22–5.81)
WBC: 8.5 10*3/uL (ref 4.0–10.5)

## 2011-11-17 LAB — DIFFERENTIAL
Eosinophils Absolute: 0.2 10*3/uL (ref 0.0–0.7)
Lymphocytes Relative: 30 % (ref 12–46)
Lymphs Abs: 2.5 10*3/uL (ref 0.7–4.0)
Monocytes Relative: 12 % (ref 3–12)
Neutrophils Relative %: 56 % (ref 43–77)

## 2011-11-17 LAB — URINALYSIS, ROUTINE W REFLEX MICROSCOPIC
Glucose, UA: NEGATIVE mg/dL
Hgb urine dipstick: NEGATIVE
Protein, ur: NEGATIVE mg/dL
Specific Gravity, Urine: 1.011 (ref 1.005–1.030)
pH: 6 (ref 5.0–8.0)

## 2011-11-17 MED ORDER — OXYCODONE-ACETAMINOPHEN 5-325 MG PO TABS: 1.0000 | ORAL_TABLET | ORAL | Status: AC | PRN

## 2011-11-17 MED ORDER — PROMETHAZINE HCL 25 MG PO TABS: 25.0000 mg | ORAL_TABLET | Freq: Four times a day (QID) | ORAL | Status: DC | PRN

## 2011-11-17 MED ORDER — MORPHINE SULFATE 4 MG/ML IJ SOLN
4.0000 mg | Freq: Once | INTRAMUSCULAR | Status: AC
  Administered 2011-11-17: 4 mg via INTRAVENOUS
  Filled 2011-11-17: qty 1

## 2011-11-17 MED ORDER — SODIUM CHLORIDE 0.9 % IV BOLUS (SEPSIS)
1000.0000 mL | Freq: Once | INTRAVENOUS | Status: AC
  Administered 2011-11-17: 1000 mL via INTRAVENOUS

## 2011-11-17 MED ORDER — NAPROXEN 500 MG PO TABS: 500.0000 mg | ORAL_TABLET | Freq: Two times a day (BID) | ORAL | Status: DC

## 2011-11-17 MED ORDER — FAMOTIDINE 20 MG PO TABS: 20.0000 mg | ORAL_TABLET | Freq: Two times a day (BID) | ORAL | Status: DC

## 2011-11-17 MED ORDER — ONDANSETRON HCL 4 MG/2ML IJ SOLN
4.0000 mg | Freq: Once | INTRAMUSCULAR | Status: AC
  Administered 2011-11-17: 4 mg via INTRAVENOUS
  Filled 2011-11-17: qty 2

## 2011-11-17 NOTE — ED Provider Notes (Signed)
History     CSN: [id number]  Arrival date & time 11/17/11  0409   First MD Initiated Contact with Patient 11/17/11 [phone]      Chief Complaint  Patient presents with  . Abdominal Pain    with nausea and vomiting started Friday night    (Consider location/radiation/quality/duration/timing/severity/associated sxs/prior treatment) HPI Comments: Onset of RUQ and epigastric pain 2 nights ago.  Stabbing pain, comes and go -Worse with eating.   No f/c but has had n/v - several episodes.  No diarrhea, / constipation - no BM since Friday.  Radiates to the R lower side.  No hx of abd surgery.  Took advil pta with no improvement.  Had some Wine on Friday night before pain started.    Patient is a 31 y.o. male presenting with abdominal pain. The history is provided by the patient.  Abdominal Pain The primary symptoms of the illness include abdominal pain and dysuria. The primary symptoms of the illness do not include fever, shortness of breath, nausea, vomiting or diarrhea.  The dysuria is not associated with frequency.  Symptoms associated with the illness do not include chills, frequency or back pain.    History reviewed. No pertinent past medical history.  History reviewed. No pertinent past surgical history.  History reviewed. No pertinent family history.  History  Substance Use Topics  . Smoking status: Not on file  . Smokeless tobacco: Not on file  . Alcohol Use: Not on file      Review of Systems  Constitutional: Negative for fever and chills.  HENT: Negative for sore throat and neck pain.   Eyes: Negative for visual disturbance.  Respiratory: Negative for cough and shortness of breath.   Cardiovascular: Negative for chest pain.  Gastrointestinal: Positive for abdominal pain. Negative for nausea, vomiting and diarrhea.  Genitourinary: Positive for dysuria. Negative for frequency.  Musculoskeletal: Negative for back pain.  Skin: Negative for rash.  Neurological: Negative for  weakness, numbness and headaches.  Hematological: Negative for adenopathy.  Psychiatric/Behavioral: Negative for behavioral problems.    Allergies  Penicillins  Home Medications   Current Outpatient Rx  Name Route Sig Dispense Refill  . NAPROXEN SODIUM 220 MG PO TABS Oral Take 220 mg by mouth 2 (two) times daily with a meal.    . NAPROXEN 500 MG PO TABS Oral Take 1 tablet (500 mg total) by mouth 2 (two) times daily with a meal. 30 tablet 0  . OXYCODONE-ACETAMINOPHEN 5-325 MG PO TABS Oral Take 1 tablet by mouth every 4 (four) hours as needed for pain. May take 2 tablets PO q 6 hours for severe pain - Do not take with Tylenol as this tablet already contains tylenol 15 tablet 0  . PROMETHAZINE HCL 25 MG PO TABS Oral Take 1 tablet (25 mg total) by mouth every 6 (six) hours as needed for nausea. 12 tablet 0    BP 129/80  Pulse 68  Temp(Src) 97.9 F (36.6 C) (Oral)  Resp 16  SpO2 97%  Physical Exam  Nursing note and vitals reviewed. Constitutional: He appears well-developed and well-nourished. No distress.  HENT:  Head: Normocephalic and atraumatic.  Mouth/Throat: Oropharynx is clear and moist. No oropharyngeal exudate.  Eyes: Conjunctivae and EOM are normal. Pupils are equal, round, and reactive to light. Right eye exhibits no discharge. Left eye exhibits no discharge. No scleral icterus.  Neck: Normal range of motion. Neck supple. No JVD present. No thyromegaly present.  Cardiovascular: Normal rate, regular rhythm, normal heart  sounds and intact distal pulses.  Exam reveals no gallop and no friction rub.   No murmur heard. Pulmonary/Chest: Effort normal and breath sounds normal. No respiratory distress. He has no wheezes. He has no rales.  Abdominal: Soft. Bowel sounds are normal. He exhibits no distension and no mass. There is tenderness ( Right upper quadrant, epigastric and right side tenderness, no guarding, no masses, morbidly obese, non-peritoneal). There is no rebound and no  guarding.  Musculoskeletal: Normal range of motion. He exhibits no edema and no tenderness.  Lymphadenopathy:    He has no cervical adenopathy.  Neurological: He is alert. Coordination normal.  Skin: Skin is warm and dry. No rash noted. No erythema.  Psychiatric: He has a normal mood and affect. His behavior is normal.    ED Course  Procedures (including critical care time)  Labs Reviewed  COMPREHENSIVE METABOLIC PANEL - Abnormal; Notable for the following:    Glucose, Bld 100 (*)    Total Bilirubin 0.1 (*)    All other components within normal limits  LIPASE, BLOOD  CBC  DIFFERENTIAL  URINALYSIS, ROUTINE W REFLEX MICROSCOPIC   US Abdomen Complete  11/17/2011  *RADIOLOGY REPORT*  Clinical Data: Abdominal pain.  ABDOMEN ULTRASOUND  Technique:  Complete abdominal ultrasound examination was performed including evaluation of the liver, gallbladder, bile ducts, pancreas, kidneys, spleen, IVC, and abdominal aorta.  Comparison: [facility] abdomen and pelvis CT exam from 10/01/2010.  Findings:  Gallbladder:  10 mm stone is identified in the gallbladder lumen. No evidence for gallbladder wall thickening or pericholecystic fluid.  The sonographer reports no sonographic Murphy's sign.  Common Bile Duct:  Nondilated at 5 mm diameter.  Liver:  Coarsening of the hepatic echotexture suggest a degree of fatty infiltration.  No focal intrahepatic parenchymal abnormality. No intrahepatic biliary duct dilatation.  IVC:  Normal.  Pancreas:  Obscured by overlying bowel gas.  Spleen:  Normal.  Right kidney:  13.4 cm in long axis.  Normal.  Left kidney:  13.0 cm in long axis.  Normal.  Abdominal Aorta:  2.7 cm maximum diameter.  Not well seen secondary to midline bowel gas.  IMPRESSION: Cholelithiasis without gallbladder wall thickening or pericholecystic fluid.  No biliary dilatation.  Original Report Authenticated By: ERIC A. MANSELL, M.D.     1. Cholelithiasis       MDM  Epigastric and right  upper quadrant abdominal pain, normal vital signs, consider that this may be related to pancreatitis or cholecystitis as this did start after taking alcohol 2 nights ago and seems to be getting worse with eating. Pain medication, laboratory workup ordered.   0600 pt reevlauted and has mild ttp in the epigastrium, no pain in the RUQ  Or R side or RLQ.  [place] pending.  Pain has resovled per pt.   I have personally reviewed the [place] and find GS's, but no other signs of acute cholecystitis - Labs show no leukocytosis, no transaminitis and normal lipase - pain has almost completely improved on one dose of pain medicines and VS are normal.  On repeat examination there is minimal epigastric ttp.  Will start pepcid, meds for possible biliary colic.  D/w Gen Surgery who agrees with f/u in clinic.  Dr/ Luz Brazen  Discharge Prescriptions include:  #1 Phenergan #2 Naprosyn #3 Percocet #4 Pepcid    Vida Roller, MD 11/17/11 [phone]

## 2011-11-17 NOTE — Discharge Instructions (Signed)
Call the office of the general surgeon today to arrange follow up this week.  Your gall bladder has some gall stones in it and may be the cause of your pain - however your blood tests and ultrasound don't show that your gall bladder needs to come out today.  Take the medicines as prescribed including phenergan for nausea, naprosyn for pain or percocet for severe pain.  Return to the ER for severe pain, vomiting or fevers.    Biliary Colic  Biliary colic is a steady or irregular pain in the upper abdomen. It is usually under the right side of the rib cage. It happens when gallstones interfere with the normal flow of bile from the gallbladder. Bile is a liquid that helps to digest fats. Bile is made in the liver and stored in the gallbladder. When you eat a meal, bile passes from the gallbladder through the cystic duct and the common bile duct into the small intestine. There, it mixes with partially digested food. If a gallstone blocks either of these ducts, the normal flow of bile is blocked. The muscle cells in the bile duct contract forcefully to try to move the stone. This causes the pain of biliary colic.  SYMPTOMS  A person with biliary colic usually complains of pain in the upper abdomen. This pain can be:  In the center of the upper abdomen just below the breastbone.  In the upper-right part of the abdomen, near the gallbladder and liver.  Spread back toward the right shoulder blade.  Nausea and vomiting.  The pain usually occurs after eating.  Biliary colic is usually triggered by the digestive system's demand for bile. The demand for bile is high after fatty meals. Symptoms can also occur when a person who has been fasting suddenly eats a very large meal. Most episodes of biliary colic pass after 1 to 5 hours. After the most intense pain passes, your abdomen may continue to ache mildly for about 24 hours.  DIAGNOSIS  After you describe your symptoms, your caregiver will perform a physical  exam. He or she will pay attention to the upper right portion of your belly (abdomen). This is the area of your liver and gallbladder. An ultrasound will help your caregiver look for gallstones. Specialized scans of the gallbladder may also be done. Blood tests may be done, especially if you have fever or if your pain persists. PREVENTION  Biliary colic can be prevented by controlling the risk factors for gallstones. Some of these risk factors, such as heredity, increasing age, and pregnancy are a normal part of life. Obesity and a high-fat diet are risk factors you can change through a healthy lifestyle. Women going through menopause who take hormone replacement therapy (estrogen) are also more likely to develop biliary colic. TREATMENT  Pain medication may be prescribed.  You may be encouraged to eat a fat-free diet.  If the first episode of biliary colic is severe, or episodes of colic keep retuning, surgery to remove the gallbladder (cholecystectomy) is usually recommended. This procedure can be done through small incisions using an instrument called a laparoscope. The procedure often requires a brief stay in the hospital. Some people can leave the hospital the same day. It is the most widely used treatment in people troubled by painful gallstones. It is effective and safe, with no complications in more than 90% of cases.  If surgery cannot be done, medication that dissolves gallstones may be used. This medication is expensive and can take  months or years to work. Only small stones will dissolve.  Rarely, medication to dissolve gallstones is combined with a procedure called shock-wave lithotripsy. This procedure uses carefully aimed shock waves to break up gallstones. In many people treated with this procedure, gallstones form again within a few years.  PROGNOSIS  If gallstones block your cystic duct or common bile duct, you are at risk for repeated episodes of biliary colic. There is also a 25% chance  that you will develop a gallbladder infection(acute cholecystitis), or some other complication of gallstones within 10 to 20 years. If you have surgery, schedule it at a time that is convenient for you and at a time when you are not sick. HOME CARE INSTRUCTIONS  Drink plenty of clear fluids.  Avoid fatty, greasy or fried foods, or any foods that make your pain worse.  Take medications as directed.  SEEK MEDICAL CARE IF:  You develop a fever over 100.5 F (38.1 C).  Your pain gets worse over time.  You develop nausea that prevents you from eating and drinking.  You develop vomiting.  SEEK IMMEDIATE MEDICAL CARE IF:  You have continuous or severe belly (abdominal) pain which is not relieved with medications.  You develop nausea and vomiting which is not relieved with medications.  You have symptoms of biliary colic and you suddenly develop a fever and shaking chills. This may signal cholecystitis. Call your caregiver immediately.  You develop a yellow color to your skin or the white part of your eyes (jaundice).  Document Released: 01/12/2006 Document Revised: 07/31/2011 Document Reviewed: 03/23/2008 [facility] Patient Information 2012 [place], [place].

## 2011-11-30 ENCOUNTER — Encounter (HOSPITAL_COMMUNITY): Payer: Self-pay | Admitting: Emergency Medicine

## 2011-11-30 ENCOUNTER — Emergency Department (HOSPITAL_COMMUNITY)
Admission: EM | Admit: 2011-11-30 | Discharge: 2011-12-01 | Disposition: A | Discharge status: Other Acute Inpatient Hospital | Payer: 59 | Attending: Emergency Medicine | Admitting: Emergency Medicine

## 2011-11-30 DIAGNOSIS — R45851 Suicidal ideations: Secondary | ICD-10-CM

## 2011-11-30 DIAGNOSIS — F329 Major depressive disorder, single episode, unspecified: Secondary | ICD-10-CM | POA: Insufficient documentation

## 2011-11-30 DIAGNOSIS — F3289 Other specified depressive episodes: Secondary | ICD-10-CM | POA: Insufficient documentation

## 2011-11-30 HISTORY — DX: Suicidal ideations: R45.851

## 2011-11-30 HISTORY — DX: Major depressive disorder, single episode, unspecified: F32.9

## 2011-11-30 HISTORY — DX: Depression, unspecified: F32.A

## 2011-11-30 LAB — COMPREHENSIVE METABOLIC PANEL
ALT: 16 U/L (ref 0–53)
Alkaline Phosphatase: 88 U/L (ref 39–117)
BUN: 13 mg/dL (ref 6–23)
CO2: 26 mEq/L (ref 19–32)
Calcium: 9.6 mg/dL (ref 8.4–10.5)
GFR calc Af Amer: >90 mL/min (ref >90)
GFR calc non Af Amer: >90 mL/min (ref >90)
Glucose, Bld: 103 mg/dL — ABNORMAL HIGH (ref 70–99)
Total Protein: 7.6 g/dL (ref 6.0–8.3)

## 2011-11-30 LAB — ETHANOL: Alcohol, Ethyl (B): <11 mg/dL (ref 0–11)

## 2011-11-30 LAB — RAPID URINE DRUG SCREEN, HOSP PERFORMED
Amphetamines: NOT DETECTED
Benzodiazepines: NOT DETECTED
Cocaine: NOT DETECTED
Opiates: NOT DETECTED

## 2011-11-30 LAB — CBC
Platelets: 289 10*3/uL (ref 150–400)
RBC: 5.24 MIL/uL (ref 4.22–5.81)
RDW: 13.4 % (ref 11.5–15.5)
WBC: 11.7 10*3/uL — ABNORMAL HIGH (ref 4.0–10.5)

## 2011-11-30 MED ORDER — IBUPROFEN 200 MG PO TABS: 600.0000 mg | ORAL_TABLET | Freq: Three times a day (TID) | ORAL | Status: DC | PRN

## 2011-11-30 MED ORDER — ACETAMINOPHEN 325 MG PO TABS
650.0000 mg | ORAL_TABLET | ORAL | Status: DC | PRN
  Administered 2011-11-30: 650 mg via ORAL
  Filled 2011-11-30: qty 2

## 2011-11-30 MED ORDER — ALUM & MAG HYDROXIDE-SIMETH 200-200-20 MG/5ML PO SUSP: 30.0000 mL | ORAL | Status: DC | PRN

## 2011-11-30 MED ORDER — FAMOTIDINE 20 MG PO TABS: 20.0000 mg | ORAL_TABLET | Freq: Two times a day (BID) | ORAL | Status: DC | PRN

## 2011-11-30 MED ORDER — LORAZEPAM 1 MG PO TABS
1.0000 mg | ORAL_TABLET | Freq: Once | ORAL | Status: AC
  Administered 2011-11-30: 1 mg via ORAL
  Filled 2011-11-30: qty 1

## 2011-11-30 MED ORDER — LORAZEPAM 1 MG PO TABS
1.0000 mg | ORAL_TABLET | Freq: Three times a day (TID) | ORAL | Status: DC | PRN
  Administered 2011-12-01: 1 mg via ORAL
  Filled 2011-11-30: qty 1

## 2011-11-30 MED ORDER — ONDANSETRON HCL 8 MG PO TABS: 4.0000 mg | ORAL_TABLET | Freq: Three times a day (TID) | ORAL | Status: DC | PRN

## 2011-11-30 MED ORDER — ZOLPIDEM TARTRATE 5 MG PO TABS: 5.0000 mg | ORAL_TABLET | Freq: Every evening | ORAL | Status: DC | PRN

## 2011-11-30 NOTE — ED Notes (Signed)
[facility] team at bedside to evaluate pt.

## 2011-11-30 NOTE — ED Notes (Signed)
Pt here with counselor who is answering questions for him.  Counselor reports pt is suicidal with multiple plans.  History of same.
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
## 2011-11-30 NOTE — ED Notes (Addendum)
Pt complaining of mild HA.

## 2011-11-30 NOTE — ED Provider Notes (Addendum)
History     CSN: [id number]  Arrival date & time 11/30/11  1733   First MD Initiated Contact with Patient 11/30/11 1853      Chief Complaint  Patient presents with  . Suicidal    (Consider location/radiation/quality/duration/timing/severity/associated sxs/prior treatment) HPI Comments: Patient presents with depression and suicidal thoughts.  His therapist actually accompanied him here.  Apparently he had e-mailed his therapist saying goodbye and she contacted him and met him in the office.  They could not come up with a safety plan and so she accompanied him here to the emergency department.  Patient endorses that he would have thoughts of cutting his wrists, overdosing or running his car off the road.  Some inciting factors are that he is going through some transgender issues and his wife who had previously been supportive has no changed her position.  She is now not supportive.  They're 4 children at home and he wants to be there to be a good father but is struggling with his depression at this time.  Patient is a 31 y.o. male presenting with mental health disorder. The history is provided by the patient and a caregiver. No language interpreter was used.  Mental Health Problem The primary symptoms include dysphoric mood. This is a chronic problem.  The onset of the illness is precipitated by a stressful event and emotional stress. Additional symptoms of the illness do not include no headaches or no abdominal pain.    Past Medical History  Diagnosis Date  . Gall stones   . Depression   . Suicidal ideation     History reviewed. No pertinent past surgical history.  History reviewed. No pertinent family history.  History  Substance Use Topics  . Smoking status: Never Smoker   . Smokeless tobacco: Not on file  . Alcohol Use: Yes      Review of Systems  Constitutional: Negative.  Negative for fever and chills.  HENT: Negative.   Eyes: Negative.  Negative for discharge and  redness.  Respiratory: Negative.  Negative for cough and shortness of breath.   Cardiovascular: Negative.  Negative for chest pain.  Gastrointestinal: Negative.  Negative for nausea, vomiting and abdominal pain.  Genitourinary: Negative.  Negative for hematuria.  Musculoskeletal: Negative.  Negative for back pain.  Skin: Negative.  Negative for color change and rash.  Neurological: Negative for syncope and headaches.  Hematological: Negative.  Negative for adenopathy.  Psychiatric/Behavioral: Positive for dysphoric mood. Negative for confusion.  All other systems reviewed and are negative.    Allergies  Penicillins  Home Medications   Current Outpatient Rx  Name Route Sig Dispense Refill  . FAMOTIDINE 20 MG PO TABS Oral Take 20 mg by mouth 2 (two) times daily as needed. For upset stomach    . NAPROXEN 500 MG PO TABS Oral Take 500 mg by mouth 2 (two) times daily as needed. For pain    . OXYCODONE-ACETAMINOPHEN 5-325 MG PO TABS Oral Take 1 tablet by mouth every 4 (four) hours as needed. For pain    . PROMETHAZINE HCL 25 MG PO TABS Oral Take 25 mg by mouth every 6 (six) hours as needed. For nausea/vomting    . PROMETHAZINE HCL 25 MG PO TABS Oral Take 1 tablet (25 mg total) by mouth every 6 (six) hours as needed for nausea. 12 tablet 0    BP 158/97  Pulse 90  Temp(Src) 98.7 F (37.1 C) (Oral)  Resp 20  SpO2 97%  Physical Exam  Nursing note and vitals reviewed. Constitutional: He is oriented to person, place, and time. He appears well-developed and well-nourished.  Non-toxic appearance. He does not have a sickly appearance.  HENT:  Head: Normocephalic and atraumatic.  Eyes: Conjunctivae, EOM and lids are normal. Pupils are equal, round, and reactive to light.  Neck: Trachea normal, normal range of motion and full passive range of motion without pain. Neck supple.  Cardiovascular: Normal rate, regular rhythm and normal heart sounds.   Pulmonary/Chest: Effort normal and breath  sounds normal. No respiratory distress.  Abdominal: Soft. Normal appearance. He exhibits no distension. There is no tenderness. There is no rebound and no CVA tenderness.  Musculoskeletal: Normal range of motion.  Neurological: He is alert and oriented to person, place, and time. He has normal strength.  Skin: Skin is warm, dry and intact. No rash noted.  Psychiatric: His behavior is normal. Judgment and thought content normal.       Depressed mood and affect.  Patient does endorse suicidal thoughts    ED Course  Procedures (including critical care time)  Results for orders placed during the hospital encounter of 11/30/11  CBC      Component Value Range   WBC 11.7 (*) 4.0 - 10.5 (K/uL)   RBC 5.24  4.22 - 5.81 (MIL/uL)   Hemoglobin 14.8  13.0 - 17.0 (g/dL)   HCT 47.8  29.5 - 62.1 (%)   MCV 84.9  78.0 - 100.0 (fL)   MCH 28.2  26.0 - 34.0 (pg)   MCHC 33.3  30.0 - 36.0 (g/dL)   RDW 30.8  65.7 - 84.6 (%)   Platelets 289  150 - 400 (K/uL)  URINE RAPID DRUG SCREEN (HOSP PERFORMED)      Component Value Range   Opiates NONE DETECTED  NONE DETECTED    Cocaine NONE DETECTED  NONE DETECTED    Benzodiazepines NONE DETECTED  NONE DETECTED    Amphetamines NONE DETECTED  NONE DETECTED    Tetrahydrocannabinol NONE DETECTED  NONE DETECTED    Barbiturates NONE DETECTED  NONE DETECTED         MDM  Patient with suicidal thoughts.  No prior attempts.  I will contact the behavioral health team for assessment of this patient.        Nat Christen, MD 11/30/11 1902  Pt's therapist is Nathen May with [facility] [phone], cell [phone]  Nat Christen, MD 11/30/11 [phone]

## 2011-11-30 NOTE — ED Notes (Signed)
Pt states that he has been having thoughts of hurting himself. Pt states that he has a plan involving a car, plan to take pills (with no access to pills at home) and a plan to slit wrist while locked in the bathroom. Pt states that he feels indifferent. Pt does not want to talk about issues and has a dull affect very depressive tone of voice. Pt denies wanting to hurt others. Pt states that he has had these thoughts in the past. Pt does not know what brought these thoughts this time.

## 2011-11-30 NOTE — ED Notes (Signed)
Denies physical sx, (denies: pain, sob, nausea or other sx), admits to feeling anxious/aggitated, pt wringing hands. Personal therapist at [facility] has temporarily stepped out. Pt given happy meal and cranberry juice per request. Labs reviewed. Ativan given. Plan & process explained.

## 2011-12-01 NOTE — ED Notes (Addendum)
Pt states that he is slightly anxious and feels like he cannot get a deep sleep. Pt ambulated to bathroom. Pt request ativan.

## 2011-12-01 NOTE — ED Notes (Signed)
Pt has been accepted at [facility], pt verbally agreed to go to [facility], pt given phone to return wife's call

## 2011-12-01 NOTE — ED Notes (Signed)
Family at bedside. Sitter at bedside.  

## 2011-12-01 NOTE — ED Provider Notes (Signed)
Filed Vitals:   12/01/11 0416  BP: 149/98  Pulse: 89  Temp: 98.4 F (36.9 C)  Resp: 16     The patient has been accepted for a bed at [facility]. He was discharged in good condition.  Cyndra Numbers, MD 12/01/11 [phone]

## 2011-12-01 NOTE — ED Notes (Signed)
carelink has been delayed d/t a critical pt

## 2011-12-01 NOTE — ED Notes (Signed)
Breakfast tray delivered

## 2011-12-01 NOTE — ED Notes (Signed)
Family at bedside. 

## 2011-12-01 NOTE — ED Notes (Addendum)
Pt sleeping, sitter at bedside

## 2011-12-01 NOTE — BH Assessment (Signed)
Assessment Note   Update:  Received report from last clinician stating pt accepted by Dr. Robet Leu to [facility].  Pt to go to the [facility].  As pt voluntary, pt to be transported to [facility] via taxi.  Updated EDP [facility] and ED staff.  Updated assessment disposition, completed assessment notification and faxed to [facility] to log.  ED staff to arrange transport.    Disposition:  Disposition Disposition of Patient: Inpatient treatment program Type of inpatient treatment program: Adult (Pt accepted [facility])  On Site Evaluation by:   Reviewed with Physician:  Hall Busing, Rennis Harding 12/01/2011 8:16 AM

## 2012-01-27 ENCOUNTER — Ambulatory Visit: Payer: 59 | Admitting: Physical Medicine & Rehabilitation

## 2012-01-27 ENCOUNTER — Encounter: Payer: Self-pay | Admitting: Physical Medicine and Rehabilitation

## 2012-01-27 ENCOUNTER — Encounter: Payer: 59 | Attending: Physical Medicine & Rehabilitation | Admitting: Physical Medicine and Rehabilitation

## 2012-01-27 VITALS — BP 165/84 | HR 86 | Resp 16 | Ht 72.0 in | Wt 281.0 lb

## 2012-01-27 DIAGNOSIS — M545 Low back pain, unspecified: Secondary | ICD-10-CM | POA: Insufficient documentation

## 2012-01-27 DIAGNOSIS — M129 Arthropathy, unspecified: Secondary | ICD-10-CM | POA: Insufficient documentation

## 2012-01-27 DIAGNOSIS — G8929 Other chronic pain: Secondary | ICD-10-CM | POA: Insufficient documentation

## 2012-01-27 DIAGNOSIS — M5126 Other intervertebral disc displacement, lumbar region: Secondary | ICD-10-CM | POA: Insufficient documentation

## 2012-01-27 DIAGNOSIS — M79609 Pain in unspecified limb: Secondary | ICD-10-CM | POA: Insufficient documentation

## 2012-01-27 DIAGNOSIS — M48061 Spinal stenosis, lumbar region without neurogenic claudication: Secondary | ICD-10-CM | POA: Insufficient documentation

## 2012-01-27 MED ORDER — METHOCARBAMOL 500 MG PO TABS: 500.0000 mg | ORAL_TABLET | Freq: Three times a day (TID) | ORAL | Status: AC

## 2012-01-27 MED ORDER — GABAPENTIN 300 MG PO CAPS: 300.0000 mg | ORAL_CAPSULE | Freq: Three times a day (TID) | ORAL | Status: DC

## 2012-01-27 MED ORDER — MELOXICAM 15 MG PO TABS: 15.0000 mg | ORAL_TABLET | Freq: Every day | ORAL | Status: DC

## 2012-01-27 NOTE — Patient Instructions (Signed)
Continue with medications and TENS unit, also continue with your exercises.

## 2012-01-27 NOTE — Progress Notes (Deleted)
Subjective:    Patient ID: Mark Rice, male    DOB: 02-21-1981, 31 y.o.   MRN: [id number]  HPI The patient complains about chronic low back pain, which radiates into his LEs bilateral, mainly in the L5 distribution , worse on the left. The patient also complains about numbness and tingling in the same distribution. The problem has been gotten worse in the last 3 weeks, and he reports, that he recently fell down some steps , which also exacerbated his symptoms. The patient reports that he has taken gabapentin in the past which has helped with the radiating symptoms. He also states that this at this point he is taking advil for pain relief. The patient also reports that he does exercises he learned from PT , and that he walks regularly with his dog. Pain Inventory Average Pain 5 Pain Right Now 9 My pain is constant, sharp, dull and aching  In the last 24 hours, has pain interfered with the following? General activity 1 Relation with others 2 Enjoyment of life 3 What TIME of day is your pain at its worst? All Day Sleep (in general) Fair  Pain is worse with: walking, bending, sitting and standing Pain improves with: medication, TENS and injections Relief from Meds: 8  Mobility walk without assistance how many minutes can you walk? 30 ability to climb steps?  yes do you drive?  yes  Function employed # of hrs/week 40  Neuro/Psych No problems in this area  Prior Studies Any changes since last visit?  no  Physicians involved in your care Any changes since last visit?  no   History reviewed. No pertinent family history. History   Social History  . Marital Status: Married    Spouse Name: N/A    Number of Children: N/A  . Years of Education: N/A   Social History Main Topics  . Smoking status: Never Smoker   . Smokeless tobacco: None  . Alcohol Use: Yes  . Drug Use: No  . Sexually Active:    Other Topics Concern  . None   Social History Narrative  . None    History reviewed. No pertinent past surgical history. Past Medical History  Diagnosis Date  . Gall stones   . Depression   . Suicidal ideation    BP 165/84  Pulse 86  Resp 16  Ht 6' (1.829 m)  Wt 281 lb (127.461 kg)  BMI 38.11 kg/m2  SpO2 90%      Review of Systems  Constitutional: Negative.   HENT: Negative.   Eyes: Negative.   Respiratory: Negative.   Cardiovascular: Negative.   Gastrointestinal: Negative.   Genitourinary: Negative.   Musculoskeletal: Positive for back pain.  Skin: Negative.   Neurological: Negative.   Hematological: Negative.   Psychiatric/Behavioral: Negative.        Objective:   Physical Exam  Constitutional: He is oriented to person, place, and time. He appears well-developed and well-nourished.       Morbidly obese  HENT:  Head: Normocephalic.  Eyes: Pupils are equal, round, and reactive to light.  Neck: Neck supple.  Musculoskeletal: He exhibits tenderness.  Neurological: He is alert and oriented to person, place, and time.  Skin: Skin is warm and dry.  Psychiatric: He has a normal mood and affect.   Symmetric normal motor tone is noted throughout. Normal muscle bulk. Muscle testing reveals 5/5 muscle strength of the upper extremity, and 5/5 of the lower extremity. Full range of motion in upper and lower  extremities. ROM of spine is not restricted. Fine motor movements are normal in both hands. Sensory is intact and symmetric to light touch, pinprick and proprioception. DTR in the upper and lower extremity are present and symmetric 2+. No clonus is noted.  Patient arises from chair without difficulty. Narrow based gait with normal arm swing bilateral , able to walk on heels and toes . Tandem walk is stable. No pronator drift. Rhomberg negative. Good finger to nose and heel to shin testing. No tremor, dystaxia or dysmetria noted.  SLR positive on the left.       Assessment & Plan:  This is a 31 year old  male with 1. 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 He should also start to walk or swim in the pool. I prescribed Mobic for his exacerbation of pain, I also prescribed Robaxin for his muscle spasms and gabapentin for his intermittend radiating pain numbness and tingling. Advise the patient to start slowly with the gabapentin, take one tablet at night for 3 days and then add another tablet for 3 more days and then at another tablet  if necessary.  Patient will followup in one month, if he is not better by then we might consider injections. He had relief with medial branch block in the past, his last treatment was in November or December of 2012.

## 2012-02-24 ENCOUNTER — Encounter: Payer: 59 | Attending: Physical Medicine & Rehabilitation | Admitting: Physical Medicine and Rehabilitation

## 2012-03-02 ENCOUNTER — Encounter: Payer: 59 | Attending: Physical Medicine & Rehabilitation | Admitting: Physical Medicine and Rehabilitation

## 2012-03-02 ENCOUNTER — Encounter: Payer: Self-pay | Admitting: Physical Medicine and Rehabilitation

## 2012-03-02 VITALS — BP 134/77 | HR 72 | Resp 14 | Ht 72.0 in | Wt 377.0 lb

## 2012-03-02 DIAGNOSIS — M545 Low back pain, unspecified: Secondary | ICD-10-CM | POA: Insufficient documentation

## 2012-03-02 DIAGNOSIS — G8929 Other chronic pain: Secondary | ICD-10-CM | POA: Insufficient documentation

## 2012-03-02 DIAGNOSIS — M5126 Other intervertebral disc displacement, lumbar region: Secondary | ICD-10-CM | POA: Insufficient documentation

## 2012-03-02 DIAGNOSIS — M129 Arthropathy, unspecified: Secondary | ICD-10-CM | POA: Insufficient documentation

## 2012-03-02 MED ORDER — GABAPENTIN 300 MG PO CAPS: 300.0000 mg | ORAL_CAPSULE | Freq: Three times a day (TID) | ORAL | Status: DC

## 2012-03-02 MED ORDER — MELOXICAM 15 MG PO TABS: 15.0000 mg | ORAL_TABLET | Freq: Every day | ORAL | Status: DC

## 2012-03-02 NOTE — Patient Instructions (Signed)
Continue with exercises , continue with walking with your dog. Continue to try to loose weight.

## 2012-03-02 NOTE — Progress Notes (Signed)
Subjective:    Patient ID: Mark Rice, male    DOB: 1980/11/13, 31 y.o.   MRN: [id number]  HPI The patient complains about chronic low back pain, which radiates into his LEs bilateral, mainly in the L5 distribution , worse on the left. The patient also complains about numbness and tingling in the same distribution.   The patient reports that he has taken gabapentin in the past which has helped with the radiating symptoms.  The patient also reports that he does exercises he learned from PT , and that he walks regularly with his dog. He states that he wants to join the gym today.  Pain Inventory Average Pain 7 Pain Right Now 7 My pain is aching  In the last 24 hours, has pain interfered with the following? General activity 7 Relation with others 7 Enjoyment of life 7 What TIME of day is your pain at its worst? varies Sleep (in general) Fair  Pain is worse with: walking, bending, standing and some activites Pain improves with: therapy/exercise, medication, TENS and injections Relief from Meds: 7  Mobility walk without assistance how many minutes can you walk? 20 ability to climb steps?  yes do you drive?  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  no  Physicians involved in your care Any changes since last visit?  no   History reviewed. No pertinent family history. History   Social History  . Marital Status: Married    Spouse Name: N/A    Number of Children: N/A  . Years of Education: N/A   Social History Main Topics  . Smoking status: Never Smoker   . Smokeless tobacco: None  . Alcohol Use: Yes  . Drug Use: No  . Sexually Active:    Other Topics Concern  . None   Social History Narrative  . None   History reviewed. No pertinent past surgical history. Past Medical History  Diagnosis Date  . Gall stones   . Depression   . Suicidal ideation    BP  134/77  Pulse 72  Resp 14  Ht 6' (1.829 m)  Wt 377 lb (171.006 kg)  BMI 51.13 kg/m2  SpO2 96%     Review of Systems  Musculoskeletal: Positive for back pain.  Neurological: Positive for weakness.  Psychiatric/Behavioral: Positive for dysphoric mood.  All other systems reviewed and are negative.       Objective:   Physical Exam  Constitutional: He is oriented to person, place, and time. He appears well-developed and well-nourished.  Morbidly obese  HENT:  Head: Normocephalic.  Eyes: Pupils are equal, round, and reactive to light.  Neck: Neck supple.  Musculoskeletal: He exhibits tenderness.  Neurological: He is alert and oriented to person, place, and time.  Skin: Skin is warm and dry.  Psychiatric: He has a normal mood and affect.   Symmetric normal motor tone is noted throughout. Normal muscle bulk. Muscle testing reveals 5/5 muscle strength of the upper extremity, and 5/5 of the lower extremity. Full range of motion in upper and lower extremities. ROM of spine is not restricted. Fine motor movements are normal in both hands.  Sensory is intact and symmetric to light touch, pinprick and proprioception.  DTR in the upper and lower extremity are present and symmetric 2+. No clonus is noted.  Patient arises from chair without difficulty. Narrow based gait with normal arm swing bilateral , able to  walk on heels and toes . Tandem walk is stable. No pronator drift. Rhomberg negative.  Good finger to nose and heel to shin testing. No tremor, dystaxia or dysmetria noted.  SLR positive on the left.       Assessment & Plan:  This is a 31 year old male with  1. 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 He should also start to walk or swim in the pool.The patient states, that he is doing a little better, since he is  taking the Mobic.  I also refilled Robaxin for his muscle spasms and gabapentin for his intermittend radiating pain numbness and tingling. Patient will followup in one month, if he is not better by then we might consider injections. He had relief with medial branch block in the past, his last treatment was in November or December of 2012.

## 2012-03-16 NOTE — Progress Notes (Signed)
Subjective:    Patient ID: Mark Rice, male    DOB: September 03, 1980, 31 y.o.   MRN: [id number]  Back Pain   The patient complains about chronic low back pain, which radiates into his LEs bilateral, mainly in the L5 distribution , worse on the left. The patient also complains about numbness and tingling in the same distribution. The problem has been gotten worse in the last 3 weeks, and he reports, that he recently fell down some steps , which also exacerbated his symptoms. The patient reports that he has taken gabapentin in the past which has helped with the radiating symptoms. He also states that this at this point he is taking advil for pain relief. The patient also reports that he does exercises he learned from PT , and that he walks regularly with his dog. Pain Inventory Average Pain 5 Pain Right Now 9 My pain is constant, sharp, dull and aching  In the last 24 hours, has pain interfered with the following? General activity 1 Relation with others 2 Enjoyment of life 3 What TIME of day is your pain at its worst? All Day Sleep (in general) Fair  Pain is worse with: walking, bending, sitting and standing Pain improves with: medication, TENS and injections Relief from Meds: 8  Mobility walk without assistance how many minutes can you walk? 30 ability to climb steps?  yes do you drive?  yes  Function employed # of hrs/week 40  Neuro/Psych No problems in this area  Prior Studies Any changes since last visit?  no  Physicians involved in your care Any changes since last visit?  no   History reviewed. No pertinent family history. History   Social History  . Marital Status: Married    Spouse Name: N/A    Number of Children: N/A  . Years of Education: N/A   Social History Main Topics  . Smoking status: Never Smoker   . Smokeless tobacco: None  . Alcohol Use: Yes  . Drug Use: No  . Sexually Active:    Other Topics Concern  . None   Social History Narrative  .  None   History reviewed. No pertinent past surgical history. Past Medical History  Diagnosis Date  . Gall stones   . Depression   . Suicidal ideation    BP 165/84  Pulse 86  Resp 16  Ht 6' (1.829 m)  Wt 281 lb (127.461 kg)  BMI 38.11 kg/m2  SpO2 90%      Review of Systems  Constitutional: Negative.   HENT: Negative.   Eyes: Negative.   Respiratory: Negative.   Cardiovascular: Negative.   Gastrointestinal: Negative.   Genitourinary: Negative.   Musculoskeletal: Positive for back pain.  Skin: Negative.   Neurological: Negative.   Hematological: Negative.   Psychiatric/Behavioral: Negative.        Objective:   Physical Exam  Constitutional: He is oriented to person, place, and time. He appears well-developed and well-nourished.       Morbidly obese  HENT:  Head: Normocephalic.  Eyes: Pupils are equal, round, and reactive to light.  Neck: Neck supple.  Musculoskeletal: He exhibits tenderness.  Neurological: He is alert and oriented to person, place, and time.  Skin: Skin is warm and dry.  Psychiatric: He has a normal mood and affect.   Symmetric normal motor tone is noted throughout. Normal muscle bulk. Muscle testing reveals 5/5 muscle strength of the upper extremity, and 5/5 of the lower extremity. Full range of motion in  upper and lower extremities. ROM of spine is not restricted. Fine motor movements are normal in both hands. Sensory is intact and symmetric to light touch, pinprick and proprioception. DTR in the upper and lower extremity are present and symmetric 2+. No clonus is noted.  Patient arises from chair without difficulty. Narrow based gait with normal arm swing bilateral , able to walk on heels and toes . Tandem walk is stable. No pronator drift. Rhomberg negative. Good finger to nose and heel to shin testing. No tremor, dystaxia or dysmetria noted.  SLR positive on the left.       Assessment & Plan:  This is a 31 year old  male with 1. 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 He should also start to walk or swim in the pool. I prescribed Mobic for his exacerbation of pain, I also prescribed Robaxin for his muscle spasms and gabapentin for his intermittend radiating pain numbness and tingling. Advise the patient to start slowly with the gabapentin, take one tablet at night for 3 days and then add another tablet for 3 more days and then at another tablet  if necessary.  Patient will followup in one month, if he is not better by then we might consider injections. He had relief with medial branch block in the past, his last treatment was in November or December of 2012.

## 2012-03-30 ENCOUNTER — Encounter: Payer: 59 | Attending: Physical Medicine and Rehabilitation | Admitting: Physical Medicine and Rehabilitation

## 2012-03-30 ENCOUNTER — Encounter: Payer: Self-pay | Admitting: Physical Medicine and Rehabilitation

## 2012-03-30 VITALS — BP 153/75 | HR 72 | Resp 14 | Ht 72.0 in | Wt 381.0 lb

## 2012-03-30 DIAGNOSIS — G8929 Other chronic pain: Secondary | ICD-10-CM | POA: Insufficient documentation

## 2012-03-30 DIAGNOSIS — M545 Low back pain, unspecified: Secondary | ICD-10-CM | POA: Insufficient documentation

## 2012-03-30 DIAGNOSIS — M129 Arthropathy, unspecified: Secondary | ICD-10-CM | POA: Insufficient documentation

## 2012-03-30 DIAGNOSIS — M5126 Other intervertebral disc displacement, lumbar region: Secondary | ICD-10-CM | POA: Insufficient documentation

## 2012-03-30 NOTE — Patient Instructions (Signed)
Continue with working out in the gym and in the pool. Follow up with Dr. Doroteo Bradford for MBB L4-5 bilateral.

## 2012-03-30 NOTE — Progress Notes (Signed)
Subjective:    Patient ID: Kalman Jewels, male    DOB: [birth year], 31 y.o.   MRN: [id number]  HPI The patient complains about chronic low back pain, which radiates into his LEs bilateral, mainly in the L5 distribution , worse on the left. The patient also complains about numbness and tingling in the same distribution. He states, that his symptoms have gotten worse. The patient reports that he has taken gabapentin in the past which has helped with the radiating symptoms. The patient also reports that he does exercises he learned from PT , and that he walks regularly with his dog. He states that he has joined the gym, and that he works out 3 times a week, in the gym and in the pool.   Pain Inventory Average Pain 5 Pain Right Now 7 My pain is sharp, stabbing and aching  In the last 24 hours, has pain interfered with the following? General activity 7 Relation with others 7 Enjoyment of life 7 What TIME of day is your pain at its worst? night Sleep (in general) Fair  Pain is worse with: walking, bending, sitting and standing Pain improves with: medication, TENS and injections Relief from Meds: 7  Mobility walk without assistance walk with assistance how many minutes can you walk? 20 ability to climb steps?  yes do you drive?  yes Do you have any goals in this area?  yes  Function employed # of hrs/week 40  Neuro/Psych numbness tingling trouble walking depression suicidal thoughts-pt has coping skills and calls his therapist if needed.  Prior Studies Any changes since last visit?  no  Physicians involved in your care Any changes since last visit?  no   History reviewed. No pertinent family history. History   Social History  . Marital Status: Married    Spouse Name: N/A    Number of Children: N/A  . Years of Education: N/A   Social History Main Topics  . Smoking status: Never Smoker   . Smokeless tobacco: None  . Alcohol Use: Yes  . Drug Use: No  . Sexually  Active:    Other Topics Concern  . None   Social History Narrative  . None   History reviewed. No pertinent past surgical history. Past Medical History  Diagnosis Date  . Gall stones   . Depression   . Suicidal ideation    BP 153/75  Pulse 72  Resp 14  Ht 6' (1.829 m)  Wt 381 lb (172.82 kg)  BMI 51.67 kg/m2  SpO2 94%     Review of Systems  Musculoskeletal: Positive for myalgias, back pain, arthralgias and gait problem.  Neurological: Positive for numbness.  Psychiatric/Behavioral: Positive for dysphoric mood.  All other systems reviewed and are negative.       Objective:   Physical Exam  Constitutional: He is oriented to person, place, and time. He appears well-developed and well-nourished.  Morbidly obese  HENT:  Head: Normocephalic.  Eyes: Pupils are equal, round, and reactive to light.  Neck: Neck supple.  Musculoskeletal: He exhibits tenderness.  Neurological: He is alert and oriented to person, place, and time.  Skin: Skin is warm and dry.  Psychiatric: He has a normal mood and affect.  Symmetric normal motor tone is noted throughout. Normal muscle bulk. Muscle testing reveals 5/5 muscle strength of the upper extremity, and 5/5 of the lower extremity. Full range of motion in upper and lower extremities. ROM of spine is not restricted. Fine motor movements are normal in  both hands.  Sensory is intact and symmetric to light touch, pinprick and proprioception.  DTR in the upper and lower extremity are present and symmetric 2+. No clonus is noted.  Patient arises from chair without difficulty. Narrow based gait with normal arm swing bilateral , able to walk on heels and toes . Tandem walk is stable. No pronator drift. Rhomberg negative.  Good finger to nose and heel to shin testing. No tremor, dystaxia or dysmetria noted.  SLR positive on the left.       Assessment & Plan:  This is a 31 year old male with  1. 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 He has started to exercise in a gym and  in the pool.The patient states, that he is doing a little better, since he is taking the Mobic. I also refilled Robaxin for his muscle spasms and gabapentin for his intermittend radiating pain numbness and tingling. Patient has not improved much since his last visit, he would like to get injections. He had relief with medial branch block in the past, his last treatment was in November or December of 2012. I ordered injections with Dr. Doroteo Bradford. Follow up in one month.

## 2012-04-29 ENCOUNTER — Encounter: Payer: 59 | Attending: Physical Medicine & Rehabilitation

## 2012-04-29 ENCOUNTER — Encounter: Payer: Self-pay | Admitting: Physical Medicine & Rehabilitation

## 2012-04-29 ENCOUNTER — Ambulatory Visit (HOSPITAL_BASED_OUTPATIENT_CLINIC_OR_DEPARTMENT_OTHER): Payer: 59 | Admitting: Physical Medicine & Rehabilitation

## 2012-04-29 VITALS — BP 146/75 | HR 90 | Resp 16 | Ht 72.0 in | Wt 378.0 lb

## 2012-04-29 DIAGNOSIS — M79609 Pain in unspecified limb: Secondary | ICD-10-CM | POA: Insufficient documentation

## 2012-04-29 DIAGNOSIS — M129 Arthropathy, unspecified: Secondary | ICD-10-CM | POA: Insufficient documentation

## 2012-04-29 DIAGNOSIS — M545 Low back pain, unspecified: Secondary | ICD-10-CM | POA: Insufficient documentation

## 2012-04-29 DIAGNOSIS — M47817 Spondylosis without myelopathy or radiculopathy, lumbosacral region: Secondary | ICD-10-CM | POA: Insufficient documentation

## 2012-04-29 DIAGNOSIS — M5126 Other intervertebral disc displacement, lumbar region: Secondary | ICD-10-CM | POA: Insufficient documentation

## 2012-04-29 DIAGNOSIS — G8929 Other chronic pain: Secondary | ICD-10-CM | POA: Insufficient documentation

## 2012-04-29 DIAGNOSIS — M48061 Spinal stenosis, lumbar region without neurogenic claudication: Secondary | ICD-10-CM | POA: Insufficient documentation

## 2012-04-29 MED ORDER — MELOXICAM 15 MG PO TABS: 15.0000 mg | ORAL_TABLET | Freq: Every day | ORAL | Status: DC

## 2012-04-29 NOTE — Patient Instructions (Addendum)
Since the medial branch block gives you at least 50% relief even for a short time then we can reschedule for a radiofrequency neurotomy We will perform the left side next month

## 2012-04-29 NOTE — Progress Notes (Signed)
Bilateral Lumbar L2, L3, L4  medial branch blocks under fluoroscopic guidance  Indication: Lumbar pain which is not relieved by medication management or other conservative care and interfering with self-care and mobility.  Informed consent was obtained after describing risks and benefits of the procedure with the patient, this includes bleeding, infection, paralysis and medication side effects.  The patient wishes to proceed and has given written consent.  The patient was placed in prone position.  The lumbar area was marked and prepped with Betadine.  One mL of 1% lidocaine was injected into each of 6 areas into the skin and subcutaneous tissue.  Then a 22-gauge 5 inch spinal needle was inserted targeting the junction of the left S1 superior articular process and sacral ala junction. Needle was advanced under fluoroscopic guidance.  Bone contact was made.  Omnipaque 180 was injected x 0.5 mL demonstrating no intravascular uptake.  Then a solution containing one mL of 4 mg per mL dexamethasone and 3 mL of 2% MPF lidocaine was injected x 0.5 mL.  Then the left L5 superior articular process in transverse process junction was targeted.  Bone contact was made.  Omnipaque 180 was injected x 0.5 mL demonstrating no intravascular uptake. Then a solution containing one mL of 4 mg per mL dexamethasone and 3 mL of 2% MPF lidocaine was injected x 0.5 mL.  Then the left L4 superior articular process in transverse process junction was targeted.  Bone contact was made.  Omnipaque 180 was injected x 0.5 mL demonstrating no intravascular uptake.  Then a solution containing one mL of 4 mg per mL dexamethasone and 3 mL if 2% MPF lidocaine was injected x 0.5 mL.  This same procedure was performed on the right side using the same needle, technique and injectate.  Patient tolerated procedure well.  Post procedure instructions were given.

## 2012-04-29 NOTE — Progress Notes (Signed)
  PROCEDURE RECORD [facility]   Name: Mark Rice DOB:1980/12/30 MRN: [id number]  Date:04/29/2012  Physician: Claudette Laws, MD    Nurse/CMA: Redgie Grayer  Allergies:  Allergies  Allergen Reactions  . Penicillins     Consent Signed: yes  Is patient diabetic? no   Pregnant: no LMP: No LMP for male patient. (age 33-55)  Anticoagulants: no Anti-inflammatory: no Antibiotics: no  Procedure: Medial Branch Block Position: Prone Start Time: 11:54am  End Time: 12:05pm Fluoro Time: 33 seconds  RN/CMA Carroll,CMA Carroll,CMA    Time 11:35am 12:07pm    BP 146/75 146/80    Pulse 90 76    Respirations 16 16    O2 Sat 96% 94%    S/S 6 6    Pain Level 7/10 5/10     D/C home with Mrs. Gribble (wife), [name] & O X 3, D/C instructions reviewed, and sits independently.

## 2012-06-07 ENCOUNTER — Ambulatory Visit (HOSPITAL_BASED_OUTPATIENT_CLINIC_OR_DEPARTMENT_OTHER): Payer: 59 | Admitting: Physical Medicine & Rehabilitation

## 2012-06-07 ENCOUNTER — Telehealth: Payer: Self-pay | Admitting: *Deleted

## 2012-06-07 ENCOUNTER — Encounter: Payer: 59 | Attending: Physical Medicine & Rehabilitation

## 2012-06-07 ENCOUNTER — Encounter: Payer: Self-pay | Admitting: Physical Medicine & Rehabilitation

## 2012-06-07 VITALS — BP 124/59 | HR 84 | Resp 14 | Ht 72.0 in | Wt 377.0 lb

## 2012-06-07 DIAGNOSIS — M47817 Spondylosis without myelopathy or radiculopathy, lumbosacral region: Secondary | ICD-10-CM

## 2012-06-07 DIAGNOSIS — M545 Low back pain, unspecified: Secondary | ICD-10-CM | POA: Insufficient documentation

## 2012-06-07 DIAGNOSIS — M48061 Spinal stenosis, lumbar region without neurogenic claudication: Secondary | ICD-10-CM | POA: Insufficient documentation

## 2012-06-07 DIAGNOSIS — G8929 Other chronic pain: Secondary | ICD-10-CM | POA: Insufficient documentation

## 2012-06-07 DIAGNOSIS — M5126 Other intervertebral disc displacement, lumbar region: Secondary | ICD-10-CM | POA: Insufficient documentation

## 2012-06-07 DIAGNOSIS — M79609 Pain in unspecified limb: Secondary | ICD-10-CM | POA: Insufficient documentation

## 2012-06-07 DIAGNOSIS — M129 Arthropathy, unspecified: Secondary | ICD-10-CM | POA: Insufficient documentation

## 2012-06-07 MED ORDER — DIAZEPAM 10 MG PO TABS: ORAL_TABLET | ORAL | Status: DC

## 2012-06-07 NOTE — Progress Notes (Signed)
  PROCEDURE RECORD [facility]   Name: Jill Ruppe DOB:February 22, 1981 MRN: [id number]  Date:06/07/2012  Physician: Claudette Laws, MD    Nurse/CMA:Shumaker RN  Allergies:  Allergies  Allergen Reactions  . Penicillins     Consent Signed: yes * has taken 10 mg Valium prior to arrival Is patient diabetic? no  CBG today?   Pregnant: no LMP: No LMP for male patient. (age 33-55)  Anticoagulants: no Anti-inflammatory: no Antibiotics: no  Procedure: Lumbar Radiofrequency Neurotomy Left Position: Prone Start Time: 2:47 End Time: 3:05 Fluoro Time: 40 seconds  RN/CMA Designer, multimedia    Time 2:36 3:12    BP 124/59 127/59    Pulse 87 85    Respirations 14 14    O2 Sat 98   97    S/S 6 6    Pain Level 10/10 10/10     D/C home with wife Thessa, [name] & O X 3, D/C instructions reviewed, and sits independently.

## 2012-06-07 NOTE — Progress Notes (Signed)
left L4 and left L3 and left L2 medial branch radio frequency neuropathy under fluoroscopic guidance  Indication: Low back pain due to lumbar spondylosis which has been relieved on 2 occasions by greater than 50% by lumbar medial branch blocks at corresponding levels.  Informed consent was obtained after describing risks and benefits of the procedure with the patient, this includes bleeding, bruising, infection, paralysis and medication side effects. The patient wishes to proceed and has given written consent. The patient was placed in a prone position. The lumbar and sacral area was marked and prepped with Betadine. A 25-gauge 1-1/2 inch needle was inserted into the skin and subcutaneous tissue at 3 sites in one ML of 1% lidocaine was injected into each site. Then a 20-gauge 15 cm radio frequency needle with a 1 cm curved active tip was inserted targeting the left L5 SAP/transverse process  junction. Bone contact was made and confirmed with lateral imaging. Sensory stimulation at 50 Hz followed by motor stimulation at 2 Hz confirm proper needle location followed by injection of one ML of the solution containing one ML of 4 mg per mL dexamethasone and 3 mL of 1% MPF lidocaine. Then the left L4 SAP/transverse process junction was targeted. Bone contact was made and confirmed with lateral imaging. Sensory stimulation at 50 Hz followed by motor stimulation at 2 Hz confirm proper needle location followed by injection of one ML of the solution containing one ML of 4 mg per mL dexamethasone and 3 mL of 1% MPF lidocaine. Then the left L3 SAP/transverse process junction was targeted. Bone contact was made and confirmed with lateral imaging. Sensory stimulation at 50 Hz followed by motor stimulation at 2 Hz confirm proper needle location followed by injection of one ML of the solution containing one ML of 4 mg per mL dexamethasone and 3 mL of 1% MPF lidocaine. Radio frequency lesion being at [facility] for 90 seconds was  performed. Needles were removed. Post procedure instructions and vital signs were performed. Patient tolerated procedure well. Followup appointment was given.

## 2012-06-07 NOTE — Patient Instructions (Addendum)
Radiofrequency Lesioning Care After Refer to this sheet in the next few weeks. These instructions provide you with information on caring for yourself after your procedure. Your caregiver may also give you more specific instructions. Your treatment has been planned according to current medical practices, but problems sometimes occur. Call your caregiver if you have any problems or questions after your procedure. HOME CARE INSTRUCTIONS  Take pain medicine as directed by your caregiver.  You may have pain from the burned nerve for a while after the procedure. This takes time to heal. Ask your caregiver how long you should expect pain.  You should be able to return to your normal activities a couple of days after the procedure. When you can go back to work will depend on the type of work you do. Discuss this with your caregiver.  Pay close attention to how you feel after the procedure. If you start having pain, write down when it hurts and how it feels. This will help you and your caregiver know if you need another treatment. SEEK MEDICAL CARE IF:  The site where the needle was inserted for the procedure becomes red, swollen, or tender to touch.  Your pain does not get better. SEEK IMMEDIATE MEDICAL CARE IF:  You develop sudden, severe pain.  You develop numbness or tingling near the procedure site.  You have a fever. MAKE SURE YOU:  Understand these instructions.  Will watch your condition.  Will get help right away if you are not doing well or get worse. Document Released: 04/09/2011 Document Revised: 11/03/2011 Document Reviewed: 04/09/2011 [facility] Patient Information 2013 [place], [place].  Take double dose Neurontin today and resume normal dose tomorrow

## 2012-06-07 NOTE — Telephone Encounter (Signed)
Was supposed to get Valium ordered for procedure.  Called medication to the pharmacy for them.

## 2012-06-15 ENCOUNTER — Emergency Department (HOSPITAL_COMMUNITY)
Admission: EM | Admit: 2012-06-15 | Discharge: 2012-06-16 | Disposition: A | Discharge status: Home / Self Care | Payer: 59 | Attending: Emergency Medicine | Admitting: Emergency Medicine

## 2012-06-15 DIAGNOSIS — R45851 Suicidal ideations: Secondary | ICD-10-CM | POA: Insufficient documentation

## 2012-06-15 DIAGNOSIS — R0602 Shortness of breath: Secondary | ICD-10-CM | POA: Insufficient documentation

## 2012-06-15 DIAGNOSIS — R079 Chest pain, unspecified: Secondary | ICD-10-CM

## 2012-06-15 DIAGNOSIS — R0789 Other chest pain: Secondary | ICD-10-CM | POA: Insufficient documentation

## 2012-06-15 DIAGNOSIS — Z87891 Personal history of nicotine dependence: Secondary | ICD-10-CM | POA: Insufficient documentation

## 2012-06-15 DIAGNOSIS — Z833 Family history of diabetes mellitus: Secondary | ICD-10-CM | POA: Insufficient documentation

## 2012-06-15 DIAGNOSIS — R209 Unspecified disturbances of skin sensation: Secondary | ICD-10-CM | POA: Insufficient documentation

## 2012-06-15 HISTORY — DX: Dorsalgia, unspecified: M54.9

## 2012-06-15 NOTE — ED Notes (Signed)
Pt states he developed chest pain this morning  Pt states it was worse this am but has been constant all day  Pt states he has had some shortness of breath associated with the pain   Pt states the pain radiates to his left arm

## 2012-06-16 ENCOUNTER — Emergency Department (HOSPITAL_COMMUNITY): Payer: 59

## 2012-06-16 ENCOUNTER — Encounter (HOSPITAL_COMMUNITY): Payer: Self-pay | Admitting: Emergency Medicine

## 2012-06-16 LAB — CBC WITH DIFFERENTIAL/PLATELET
Basophils Relative: 0 % (ref 0–1)
Eosinophils Absolute: 0.2 10*3/uL (ref 0.0–0.7)
Eosinophils Relative: 2 % (ref 0–5)
Hemoglobin: 14.1 g/dL (ref 13.0–17.0)
MCH: 28.1 pg (ref 26.0–34.0)
MCHC: 33 g/dL (ref 30.0–36.0)
MCV: 85.1 fL (ref 78.0–100.0)
Monocytes Relative: 13 % — ABNORMAL HIGH (ref 3–12)
Neutrophils Relative %: 52 % (ref 43–77)
Platelets: 273 10*3/uL (ref 150–400)

## 2012-06-16 LAB — COMPREHENSIVE METABOLIC PANEL
Albumin: 3.9 g/dL (ref 3.5–5.2)
Alkaline Phosphatase: 72 U/L (ref 39–117)
BUN: 14 mg/dL (ref 6–23)
Calcium: 9.1 mg/dL (ref 8.4–10.5)
GFR calc Af Amer: >90 mL/min (ref >90)
Glucose, Bld: 96 mg/dL (ref 70–99)
Potassium: 4.5 mEq/L (ref 3.5–5.1)
Total Protein: 7.5 g/dL (ref 6.0–8.3)

## 2012-06-16 LAB — POCT I-STAT TROPONIN I: Troponin i, poc: 0 ng/mL (ref 0.00–0.08)

## 2012-06-16 MED ORDER — GI COCKTAIL ~~LOC~~
30.0000 mL | Freq: Once | ORAL | Status: AC
  Administered 2012-06-16: 30 mL via ORAL
  Filled 2012-06-16: qty 30

## 2012-06-16 MED ORDER — OXYCODONE-ACETAMINOPHEN 5-325 MG PO TABS
2.0000 | ORAL_TABLET | Freq: Once | ORAL | Status: AC
  Administered 2012-06-16: 2 via ORAL
  Filled 2012-06-16: qty 2

## 2012-06-16 NOTE — ED Provider Notes (Signed)
Medical screening examination/treatment/procedure(s) were performed by non-physician practitioner and as supervising physician I was immediately available for consultation/collaboration.  Olivia Mackie, MD 06/16/12 [phone]

## 2012-06-16 NOTE — ED Notes (Addendum)
Pt reports central chest pain that started at 0700. Pt states, "I didn't come earlier, because I'm stubborn." Pt reports being initially diaphoretic, nausea, and blurred vision. Denies headache, emesis. Reports chronic back pain.

## 2012-06-16 NOTE — ED Provider Notes (Signed)
History     CSN: [id number]  Arrival date & time 06/15/12  2353   First MD Initiated Contact with Patient 06/16/12 0123      Chief Complaint  Patient presents with  . Chest Pain   HPI  History provided by the patient. Patient is a 31 year old male with previous history of gallstones, depression with occasional SI and chronic back pain who presents with complaints of sternal chest pain. Symptoms began earlier in the morning around 7 AM after getting an argument. Patient denies any strenuous activity or injury. Symptoms have been persistent without change since that time. Patient does however state pain is slightly eased off in the past few hours. It is described as a continuous ache and soreness. He denies any aggravating or alleviating factors. He has not use any medications or other treatments for symptoms. Patient also complains of some radiation and tingling sensation to his left arm. Symptoms have occasionally been associated with shortness of breath as well. Denies any weakness. Denies any neck pain.       Past Medical History  Diagnosis Date  . Gall stones   . Depression   . Suicidal ideation   . Back pain     History reviewed. No pertinent past surgical history.  Family History  Problem Relation Age of Onset  . Diabetes Father     History  Substance Use Topics  . Smoking status: Current Some Day Smoker    Types: Cigarettes  . Smokeless tobacco: Not on file  . Alcohol Use: Yes     occ      Review of Systems  Constitutional: Negative for fever, chills and diaphoresis.  HENT: Negative for neck pain and neck stiffness.   Respiratory: Positive for shortness of breath. Negative for cough and wheezing.   Cardiovascular: Positive for chest pain. Negative for palpitations and leg swelling.  Gastrointestinal: Negative for nausea, vomiting, abdominal pain, diarrhea and constipation.  Skin: Negative for rash.  Neurological: Positive for numbness. Negative for  weakness and headaches.    Allergies  Penicillins  Home Medications   Current Outpatient Rx  Name Route Sig Dispense Refill  . FAMOTIDINE 20 MG PO TABS Oral Take 20 mg by mouth 2 (two) times daily as needed. For upset stomach    . GABAPENTIN 300 MG PO CAPS Oral Take 1 capsule (300 mg total) by mouth 3 (three) times daily. 90 capsule 0  . MELOXICAM 15 MG PO TABS Oral Take 1 tablet (15 mg total) by mouth daily. 30 tablet 1  . METHOCARBAMOL 500 MG PO TABS Oral Take 500 mg by mouth 4 (four) times daily.       BP 138/76  Pulse 80  Temp 97.9 F (36.6 C) (Oral)  Resp 16  SpO2 98%  Physical Exam  Nursing note and vitals reviewed. Constitutional: He is oriented to person, place, and time. He appears well-developed and well-nourished. No distress.  HENT:  Head: Normocephalic and atraumatic.  Neck: Normal range of motion. Neck supple.       No cervical midline tenderness  Cardiovascular: Normal rate and regular rhythm.   No murmur heard. Pulmonary/Chest: Effort normal and breath sounds normal. No respiratory distress. He has no wheezes. He has no rales. He exhibits no tenderness.  Abdominal: Soft. There is no tenderness. There is no rebound and no guarding.  Neurological: He is alert and oriented to person, place, and time.  Skin: Skin is warm.  Psychiatric: He has a normal mood and affect. His  behavior is normal.    ED Course  Procedures   Results for orders placed during the hospital encounter of 06/15/12  CBC WITH DIFFERENTIAL      Component Value Range   WBC 7.9  4.0 - 10.5 K/uL   RBC 5.02  4.22 - 5.81 MIL/uL   Hemoglobin 14.1  13.0 - 17.0 g/dL   HCT 16.1  09.6 - 04.5 %   MCV 85.1  78.0 - 100.0 fL   MCH 28.1  26.0 - 34.0 pg   MCHC 33.0  30.0 - 36.0 g/dL   RDW 40.9  81.1 - 91.4 %   Platelets 273  150 - 400 K/uL   Neutrophils Relative 52  43 - 77 %   Neutro Abs 4.1  1.7 - 7.7 K/uL   Lymphocytes Relative 33  12 - 46 %   Lymphs Abs 2.6  0.7 - 4.0 K/uL   Monocytes  Relative 13 (*) 3 - 12 %   Monocytes Absolute 1.0  0.1 - 1.0 K/uL   Eosinophils Relative 2  0 - 5 %   Eosinophils Absolute 0.2  0.0 - 0.7 K/uL   Basophils Relative 0  0 - 1 %   Basophils Absolute 0.0  0.0 - 0.1 K/uL  COMPREHENSIVE METABOLIC PANEL      Component Value Range   Sodium 133 (*) 135 - 145 mEq/L   Potassium 4.5  3.5 - 5.1 mEq/L   Chloride 99  96 - 112 mEq/L   CO2 24  19 - 32 mEq/L   Glucose, Bld 96  70 - 99 mg/dL   BUN 14  6 - 23 mg/dL   Creatinine, Ser 7.82  0.50 - 1.35 mg/dL   Calcium 9.1  8.4 - 95.6 mg/dL   Total Protein 7.5  6.0 - 8.3 g/dL   Albumin 3.9  3.5 - 5.2 g/dL   AST 22  0 - 37 U/L   ALT 15  0 - 53 U/L   Alkaline Phosphatase 72  39 - 117 U/L   Total Bilirubin 0.3  0.3 - 1.2 mg/dL   GFR calc non Af Amer >90  >90 mL/min   GFR calc Af Amer >90  >90 mL/min  POCT I-STAT TROPONIN I      Component Value Range   Troponin i, poc 0.00  0.00 - 0.08 ng/mL   Comment 3                Dg Chest 2 View  06/16/2012  *RADIOLOGY REPORT*  Clinical Data: Upper chest pain.  CHEST - 2 VIEW  Comparison: 10/18/2010  Findings: Two views of the chest demonstrate clear lungs. Heart and mediastinum are within normal limits.  The trachea is midline and bony thorax is intact.  IMPRESSION: No acute cardiopulmonary disease.   Original Report Authenticated By: Richarda Overlie, M.D.      1. Chest pain       MDM  Patient seen and evaluated. Patient appears comfortable in no acute distress at this time.  Patient with ongoing pain since 7 AM. Lab tests unremarkable. EKG for concerning. Normal chest x-ray. Negative troponin. At this time doubt ACS. Patient has no significant risk factors. At this time patient felt safe to discharge home.     Date: 06/16/2012  Rate: 77  Rhythm: normal sinus rhythm  QRS Axis: normal  Intervals: normal  ST/T Wave abnormalities: normal  Conduction Disutrbances:none  Narrative Interpretation:   Old EKG Reviewed: unchanged from 10/18/2010  Angus Seller, [place] 06/16/12 [id number]

## 2012-07-13 ENCOUNTER — Telehealth: Payer: Self-pay

## 2012-07-13 NOTE — Telephone Encounter (Signed)
Patient saying he would like to start back on tramadol.  Advised him to make appt.  He agreed.

## 2012-07-13 NOTE — Telephone Encounter (Signed)
Patient would like his pain med refilled.  Did not say what one.  He says his pain is a 10 and nothing is helping.  He would like something stronger.

## 2012-07-15 ENCOUNTER — Encounter: Payer: Self-pay | Admitting: Physical Medicine and Rehabilitation

## 2012-07-15 ENCOUNTER — Encounter: Payer: 59 | Attending: Physical Medicine and Rehabilitation | Admitting: Physical Medicine and Rehabilitation

## 2012-07-15 VITALS — BP 164/85 | HR 77 | Resp 16 | Ht 72.0 in | Wt 392.0 lb

## 2012-07-15 DIAGNOSIS — G8929 Other chronic pain: Secondary | ICD-10-CM | POA: Insufficient documentation

## 2012-07-15 DIAGNOSIS — M47816 Spondylosis without myelopathy or radiculopathy, lumbar region: Secondary | ICD-10-CM

## 2012-07-15 DIAGNOSIS — M545 Low back pain, unspecified: Secondary | ICD-10-CM | POA: Insufficient documentation

## 2012-07-15 DIAGNOSIS — M47817 Spondylosis without myelopathy or radiculopathy, lumbosacral region: Secondary | ICD-10-CM

## 2012-07-15 DIAGNOSIS — M129 Arthropathy, unspecified: Secondary | ICD-10-CM | POA: Insufficient documentation

## 2012-07-15 DIAGNOSIS — R209 Unspecified disturbances of skin sensation: Secondary | ICD-10-CM | POA: Insufficient documentation

## 2012-07-15 DIAGNOSIS — M5126 Other intervertebral disc displacement, lumbar region: Secondary | ICD-10-CM | POA: Insufficient documentation

## 2012-07-15 MED ORDER — TRAMADOL HCL 50 MG PO TABS: 50.0000 mg | ORAL_TABLET | Freq: Four times a day (QID) | ORAL | Status: DC | PRN

## 2012-07-15 NOTE — Patient Instructions (Signed)
Take on tablet of the tramadol for three days, then you can take  1 tablet every 6 hrs , prn pain, do not take more than 3 tablets a day.  Restart your exercise program.

## 2012-07-15 NOTE — Progress Notes (Signed)
Subjective:    Patient ID: Mark Rice, male    DOB: 1980/08/29, 31 y.o.   MRN: [id number]  HPI The patient complains about chronic low back pain, which radiates into his LEs bilateral, mainly in the L5 distribution , worse on the left. The patient also complains about numbness and tingling in the same distribution. He states, that his symptoms have improved by 40 % after receiving MBB L2,3,4, bilateral. He states, that he has some increased pain after he moved recently, he is asking whether I could prescribe Tramadol for his pain, he states, that he has taken this medication in the past , it had helped him and he tolerates it well. The patient reports that he has taken gabapentin in the past which has helped with the radiating symptoms. The patient also reports that he does exercises he learned from PT , but that he does not walk regularly with his dog anymore, because it died when it was hit by a car.    Pain Inventory Average Pain 9 Pain Right Now 9 My pain is constant, sharp and aching  In the last 24 hours, has pain interfered with the following? General activity 9 Relation with others 9 Enjoyment of life 9 What TIME of day is your pain at its worst? all the time Sleep (in general) Poor  Pain is worse with: walking, bending, sitting, inactivity and standing Pain improves with: medication Relief from Meds: 2  Mobility walk without assistance use a cane how many minutes can you walk? 15-20 ability to climb steps?  yes do you drive?  yes  Function employed # of hrs/week 30-40  Neuro/Psych bladder control problems weakness tingling trouble walking  Prior Studies bone scan x-rays CT/MRI nerve study  Physicians involved in your care Any changes since last visit?  no   Family History  Problem Relation Age of Onset  . Diabetes Father    History   Social History  . Marital Status: Married    Spouse Name: N/A    Number of Children: N/A  . Years of Education:  N/A   Social History Main Topics  . Smoking status: Former Smoker    Types: Cigarettes    Quit date: 06/14/2012  . Smokeless tobacco: None  . Alcohol Use: Yes     Comment: occ  . Drug Use: No  . Sexually Active: None   Other Topics Concern  . None   Social History Narrative  . None   History reviewed. No pertinent past surgical history. Past Medical History  Diagnosis Date  . Gall stones   . Depression   . Suicidal ideation   . Back pain    BP 164/85  Pulse 77  Resp 16  Ht 6' (1.829 m)  Wt 392 lb (177.81 kg)  BMI 53.16 kg/m2  SpO2 98%   Review of Systems  Gastrointestinal: Positive for nausea.  Musculoskeletal: Positive for back pain and gait problem.  Neurological: Positive for weakness.       Tingling  All other systems reviewed and are negative.       Objective:   Physical Exam Constitutional: He is oriented to person, place, and time. He appears well-developed and well-nourished.  Morbidly obese  HENT:  Head: Normocephalic.  Eyes: Pupils are equal, round, and reactive to light.  Neck: Neck supple.  Musculoskeletal: He exhibits tenderness.  Neurological: He is alert and oriented to person, place, and time.  Skin: Skin is warm and dry.  Psychiatric: He has a  normal mood and affect.  Symmetric normal motor tone is noted throughout. Normal muscle bulk. Muscle testing reveals 5/5 muscle strength of the upper extremity, and 5/5 of the lower extremity. Full range of motion in upper and lower extremities. ROM of spine is not restricted. Fine motor movements are normal in both hands.  Sensory is intact and symmetric to light touch, pinprick and proprioception.  DTR in the upper and lower extremity are present and symmetric 2+. No clonus is noted.  Patient arises from chair without difficulty. Narrow based gait with normal arm swing bilateral , able to walk on heels and toes . Tandem walk is stable. No pronator drift. Rhomberg negative.  Good finger to nose and  heel to shin testing. No tremor, dystaxia or dysmetria noted.  SLR negative bilateral, was positive on the left before injections.         Assessment & Plan:  This is a 31 year old male with  1. 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 He has started to exercise in a gym and in the pool.The patient states, that he is doing a little better, since he is taking the Mobic. I also refilled Robaxin for his muscle spasms and gabapentin for his intermittend radiating pain numbness and tingling. Dr. Doroteo Bradford did MBB at L2,3,4 bilateral a couple weeks ago, the patient states,that his symptoms have improved by 40 % . He states, that he has some increased pain after he moved recently, he is asking whether I could prescribe Tramadol for his pain, he states, that he has taken this medication in the past , it had helped him and he tolerates it well. I prescribed Tramadol 50mg  start with one a day, after 3 days , up to 3 per day q 6hrs, prn pain. Follow up in 2 month, patient has app. for repeat injections on Dec. 12th.

## 2012-08-05 ENCOUNTER — Encounter: Payer: Self-pay | Admitting: Physical Medicine & Rehabilitation

## 2012-08-05 ENCOUNTER — Encounter: Payer: 59 | Attending: Physical Medicine & Rehabilitation

## 2012-08-05 ENCOUNTER — Ambulatory Visit (HOSPITAL_BASED_OUTPATIENT_CLINIC_OR_DEPARTMENT_OTHER): Payer: 59 | Admitting: Physical Medicine & Rehabilitation

## 2012-08-05 VITALS — BP 169/89 | HR 91 | Resp 14 | Ht 72.0 in | Wt 382.0 lb

## 2012-08-05 DIAGNOSIS — M79609 Pain in unspecified limb: Secondary | ICD-10-CM | POA: Insufficient documentation

## 2012-08-05 DIAGNOSIS — G8929 Other chronic pain: Secondary | ICD-10-CM | POA: Insufficient documentation

## 2012-08-05 DIAGNOSIS — M545 Low back pain, unspecified: Secondary | ICD-10-CM | POA: Insufficient documentation

## 2012-08-05 DIAGNOSIS — M47817 Spondylosis without myelopathy or radiculopathy, lumbosacral region: Secondary | ICD-10-CM

## 2012-08-05 DIAGNOSIS — M5126 Other intervertebral disc displacement, lumbar region: Secondary | ICD-10-CM | POA: Insufficient documentation

## 2012-08-05 DIAGNOSIS — M129 Arthropathy, unspecified: Secondary | ICD-10-CM | POA: Insufficient documentation

## 2012-08-05 DIAGNOSIS — M48061 Spinal stenosis, lumbar region without neurogenic claudication: Secondary | ICD-10-CM | POA: Insufficient documentation

## 2012-08-05 NOTE — Progress Notes (Signed)
  PROCEDURE RECORD [facility]   Name: Mark Rice DOB:1981-01-21 MRN: [id number]  Date:08/05/2012  Physician: Claudette Laws, MD    Nurse/CMA: Kelli Churn, CMA/Shumaker, RN  Allergies:  Allergies  Allergen Reactions  . Penicillins     Consent Signed: yes  Is patient diabetic? no   Pregnant: no LMP: No LMP for male patient. (age 33-55)  Anticoagulants: no Anti-inflammatory: no Antibiotics: no  Procedure: Lumbar radiofrequency Neurotomy L2-3-4  Position: Prone Start Time: 10:18 End Time:10"37  Fluoro Time:38 seconds  RN/CMA Treyshon Buchanon, CMA Shumaker RN    Time 9:33am 10:45    BP 163/92 154/86    Pulse 91 79    Respirations 14 14    O2 Sat 97 98    S/S 6 6    Pain Level 8 5     D/C home with Wife, [name] & O X 3, D/C instructions reviewed, and sits independently.

## 2012-08-05 NOTE — Patient Instructions (Signed)
Radiofrequency Lesioning Care After Refer to this sheet in the next few weeks. These instructions provide you with information on caring for yourself after your procedure. Your caregiver may also give you more specific instructions. Your treatment has been planned according to current medical practices, but problems sometimes occur. Call your caregiver if you have any problems or questions after your procedure. HOME CARE INSTRUCTIONS  Take pain medicine as directed by your caregiver.  You may have pain from the burned nerve for a while after the procedure. This takes time to heal. Ask your caregiver how long you should expect pain.  You should be able to return to your normal activities a couple of days after the procedure. When you can go back to work will depend on the type of work you do. Discuss this with your caregiver.  Pay close attention to how you feel after the procedure. If you start having pain, write down when it hurts and how it feels. This will help you and your caregiver know if you need another treatment. SEEK MEDICAL CARE IF:  The site where the needle was inserted for the procedure becomes red, swollen, or tender to touch.  Your pain does not get better. SEEK IMMEDIATE MEDICAL CARE IF:  You develop sudden, severe pain.  You develop numbness or tingling near the procedure site.  You have a fever. MAKE SURE YOU:  Understand these instructions.  Will watch your condition.  Will get help right away if you are not doing well or get worse. Document Released: 04/09/2011 Document Revised: 11/03/2011 Document Reviewed: 04/09/2011 [employer] Patient Information 2013 [employer].  

## 2012-08-05 NOTE — Progress Notes (Signed)
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  Informed consent was obtained after describing risks and benefits of the procedure with the patient, this includes bleeding, bruising, infection, paralysis and medication side effects. The patient wishes to proceed and has given written consent. The patient was placed in a prone position. The lumbar and sacral area was marked and prepped with Betadine. A 25-gauge 1-1/2 inch needle was inserted into the skin and subcutaneous tissue at 3 sites in one ML of 1% lidocaine was injected into each site. Then a 20-gauge 15cm cm radio frequency needle with a 1 cm curved active tip was inserted targeting the Right L3 SAP/transverse process jct. Bone contact was made and confirmed with lateral imaging. Sensory stimulation at 50 Hz followed by motor stimulation at 2 Hz confirm proper needle location followed by injection of one ML of the solution containing one ML of 4 mg per mL dexamethasone and 3 mL of 1% MPF lidocaine. Then the Right L5 SAP/transverse process junction was targeted. Bone contact was made and confirmed with lateral imaging. Sensory stimulation at 50 Hz followed by motor stimulation at 2 Hz confirm proper needle location followed by injection of one ML of the solution containing one ML of 4 mg per mL dexamethasone and 3 mL of 1% MPF lidocaine. Then the Right L4 SAP/transverse process junction was targeted. Bone contact was made and confirmed with lateral imaging. Sensory stimulation at 50 Hz followed by motor stimulation at 2 Hz confirm proper needle location followed by injection of one ML of the solution containing one ML of 4 mg per mL dexamethasone and 3 mL of 1% MPF lidocaine. Radio frequency lesion being at [facility] for 90 seconds was  performed. Needles were removed. Post procedure instructions and vital signs were performed. Patient tolerated procedure well. Followup appointment was given.

## 2012-09-09 ENCOUNTER — Ambulatory Visit: Payer: 59 | Admitting: Physical Medicine & Rehabilitation

## 2012-09-09 ENCOUNTER — Emergency Department (HOSPITAL_COMMUNITY)
Admission: EM | Admit: 2012-09-09 | Discharge: 2012-09-09 | Disposition: A | Discharge status: Home / Self Care | Payer: 59 | Attending: Emergency Medicine | Admitting: Emergency Medicine

## 2012-09-09 ENCOUNTER — Encounter (HOSPITAL_COMMUNITY): Payer: Self-pay | Admitting: Emergency Medicine

## 2012-09-09 DIAGNOSIS — R369 Urethral discharge, unspecified: Secondary | ICD-10-CM | POA: Insufficient documentation

## 2012-09-09 DIAGNOSIS — Z87891 Personal history of nicotine dependence: Secondary | ICD-10-CM | POA: Insufficient documentation

## 2012-09-09 DIAGNOSIS — Z202 Contact with and (suspected) exposure to infections with a predominantly sexual mode of transmission: Secondary | ICD-10-CM

## 2012-09-09 DIAGNOSIS — Z8659 Personal history of other mental and behavioral disorders: Secondary | ICD-10-CM | POA: Insufficient documentation

## 2012-09-09 DIAGNOSIS — Z87442 Personal history of urinary calculi: Secondary | ICD-10-CM | POA: Insufficient documentation

## 2012-09-09 DIAGNOSIS — Z79899 Other long term (current) drug therapy: Secondary | ICD-10-CM | POA: Insufficient documentation

## 2012-09-09 LAB — URINALYSIS, ROUTINE W REFLEX MICROSCOPIC
Bilirubin Urine: NEGATIVE
Ketones, ur: NEGATIVE mg/dL
Nitrite: NEGATIVE
Protein, ur: NEGATIVE mg/dL
Urobilinogen, UA: 1 mg/dL (ref 0.0–1.0)

## 2012-09-09 LAB — URINE MICROSCOPIC-ADD ON

## 2012-09-09 MED ORDER — AZITHROMYCIN 250 MG PO TABS
1000.0000 mg | ORAL_TABLET | Freq: Once | ORAL | Status: AC
  Administered 2012-09-09: 1000 mg via ORAL
  Filled 2012-09-09: qty 4

## 2012-09-09 MED ORDER — AZITHROMYCIN 250 MG PO TABS
ORAL_TABLET | ORAL | Status: AC
  Filled 2012-09-09: qty 1

## 2012-09-09 MED ORDER — CIPROFLOXACIN HCL 500 MG PO TABS
500.0000 mg | ORAL_TABLET | Freq: Two times a day (BID) | ORAL | Status: DC
  Administered 2012-09-09: 500 mg via ORAL
  Filled 2012-09-09: qty 1

## 2012-09-09 NOTE — ED Provider Notes (Signed)
Medical screening examination/treatment/procedure(s) were performed by non-physician practitioner and as supervising physician I was immediately available for consultation/collaboration.   Krystan Northrop M Ketzia Guzek, MD 09/09/12 1420 

## 2012-09-09 NOTE — ED Notes (Signed)
Pt states " I need to be checked for std, I have been having a slight discharge and odor for about 2 weeks"

## 2012-09-09 NOTE — ED Provider Notes (Signed)
History     CSN: [id number]  Arrival date & time 09/09/12  1027   First MD Initiated Contact with Patient 09/09/12 1042      Chief Complaint  Patient presents with  . Exposure to STD    (Consider location/radiation/quality/duration/timing/severity/associated sxs/prior treatment) HPI Comments: Patient is a 32 year old male who presents with a 2 week history of penile discharge. The discharge is clear and malodorous. He has noticed the discharge everyday for the past 2 weeks. He denies any new sexual partners. He has not tried anything for symptoms. No aggravating/alleviating factors. No associated symptoms.    Past Medical History  Diagnosis Date  . Gall stones   . Depression   . Suicidal ideation   . Back pain     Past Surgical History  Procedure Date  . Wisdom tooth extraction     Family History  Problem Relation Age of Onset  . Diabetes Father     History  Substance Use Topics  . Smoking status: Former Smoker    Types: Cigarettes    Quit date: 06/14/2012  . Smokeless tobacco: Not on file  . Alcohol Use: Yes     Comment: occ      Review of Systems  Genitourinary: Positive for discharge.  All other systems reviewed and are negative.    Allergies  Penicillins  Home Medications   Current Outpatient Rx  Name  Route  Sig  Dispense  Refill  . ESTRADIOL 2 MG PO TABS   Oral   Take 2 mg by mouth 2 (two) times daily.          Marland Kitchen GABAPENTIN 300 MG PO CAPS   Oral   Take 1 capsule (300 mg total) by mouth 3 (three) times daily.   90 capsule   0   . METHOCARBAMOL 500 MG PO TABS   Oral   Take 500 mg by mouth 4 (four) times daily.          . TRAMADOL HCL 50 MG PO TABS   Oral   Take 1 tablet (50 mg total) by mouth every 6 (six) hours as needed for pain.   90 tablet   0     BP 148/83  Pulse 97  Temp 98.8 F (37.1 C) (Oral)  Resp 16  SpO2 96%  Physical Exam  Nursing note and vitals reviewed. Constitutional: He is oriented to person, place,  and time. He appears well-developed and well-nourished. No distress.  HENT:  Head: Normocephalic and atraumatic.  Eyes: Conjunctivae normal are normal.  Neck: Normal range of motion. Neck supple.  Cardiovascular: Normal rate and regular rhythm.  Exam reveals no gallop and no friction rub.   No murmur heard. Pulmonary/Chest: Effort normal and breath sounds normal. He has no wheezes. He has no rales. He exhibits no tenderness.  Abdominal: Soft. There is no tenderness.  Genitourinary: Penis normal. No penile tenderness.       No penile discharge noted. No odor noted.   Musculoskeletal: Normal range of motion.  Neurological: He is alert and oriented to person, place, and time.       Speech is goal-oriented. Moves limbs without ataxia.   Skin: Skin is warm and dry.  Psychiatric: He has a normal mood and affect. His behavior is normal.    ED Course  Procedures (including critical care time)  Labs Reviewed  URINALYSIS, ROUTINE W REFLEX MICROSCOPIC - Abnormal; Notable for the following:    APPearance CLOUDY (*)  Leukocytes, UA SMALL (*)     All other components within normal limits  URINE MICROSCOPIC-ADD ON - Abnormal; Notable for the following:    Squamous Epithelial / LPF FEW (*)     Bacteria, UA FEW (*)     All other components within normal limits  GC/CHLAMYDIA PROBE AMP  URINE CULTURE   No results found.   1. Possible exposure to STD       MDM  11:32 AM Patient swabbed and patient will be contacted with positive results within 48 hours. Urinalysis unremarkable. Patient will be treated with Cipro and Azithromycin. No further evaluation needed at this time. Patient given health department contact info.         Emilia Beck, PA-C 09/09/12 1309

## 2012-09-10 LAB — URINE CULTURE: Colony Count: NO GROWTH

## 2012-09-10 LAB — GC/CHLAMYDIA PROBE AMP
CT Probe RNA: NEGATIVE
GC Probe RNA: NEGATIVE

## 2012-09-13 ENCOUNTER — Ambulatory Visit: Payer: 59 | Admitting: Physical Medicine and Rehabilitation

## 2012-11-21 ENCOUNTER — Other Ambulatory Visit: Payer: Self-pay | Admitting: Physical Medicine & Rehabilitation

## 2012-12-30 ENCOUNTER — Telehealth: Payer: Self-pay

## 2012-12-30 NOTE — Telephone Encounter (Signed)
Patient request tramadol and gabapentin refills.  He has not had these filled in some time so he will schedule an appointment.

## 2012-12-31 ENCOUNTER — Encounter: Payer: Self-pay | Admitting: Physical Medicine & Rehabilitation

## 2012-12-31 ENCOUNTER — Encounter: Payer: 59 | Attending: Physical Medicine & Rehabilitation

## 2012-12-31 ENCOUNTER — Ambulatory Visit (HOSPITAL_BASED_OUTPATIENT_CLINIC_OR_DEPARTMENT_OTHER): Payer: 59 | Admitting: Physical Medicine & Rehabilitation

## 2012-12-31 VITALS — BP 146/86 | HR 99 | Resp 16 | Ht 72.0 in | Wt >= 6400 oz

## 2012-12-31 DIAGNOSIS — Z79899 Other long term (current) drug therapy: Secondary | ICD-10-CM | POA: Insufficient documentation

## 2012-12-31 DIAGNOSIS — M47817 Spondylosis without myelopathy or radiculopathy, lumbosacral region: Secondary | ICD-10-CM | POA: Insufficient documentation

## 2012-12-31 DIAGNOSIS — G8929 Other chronic pain: Secondary | ICD-10-CM | POA: Insufficient documentation

## 2012-12-31 MED ORDER — TRAMADOL HCL 50 MG PO TABS: 50.0000 mg | ORAL_TABLET | Freq: Four times a day (QID) | ORAL | Status: DC | PRN

## 2012-12-31 NOTE — Patient Instructions (Addendum)
Radiofrequency procedure in approximately one month. On or after 02/03/2013  Tramadol prescription is at the [facility] on [address]

## 2012-12-31 NOTE — Progress Notes (Signed)
Subjective:    Patient ID: Mark Rice, male    DOB: 01/07/1981, 32 y.o.   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 General activity 10 Relation with others 7 Enjoyment of life 10 What TIME of day is your pain at its worst? constant Sleep (in general) Poor  Pain is worse with: walking, bending, sitting, inactivity and standing Pain improves with: medication and injections Relief from Meds: 0  Mobility walk with assistance use a cane how many minutes can you walk? 30 ability to climb steps?  yes do you drive?  yes Do you have any goals in this area?  yes  Function employed # of hrs/week 40 I need assistance with the following:  dressing and household duties Do you have any goals in this area?  yes  Neuro/Psych bladder control problems tingling trouble walking  Prior Studies Any changes since last visit?  no  Physicians involved in your care Any changes since last visit?  no   Family History  Problem Relation Age of Onset  . Diabetes Father    History   Social History  . Marital Status: Married    Spouse Name: N/A    Number of Children: N/A  . Years of Education: N/A   Social History Main Topics  . Smoking status: Former Smoker    Types: Cigarettes    Quit date: 06/14/2012  . Smokeless tobacco: None  . Alcohol Use: Yes     Comment: occ  . Drug Use: No  . Sexually Active: None   Other Topics Concern  . None   Social History Narrative  . None   Past Surgical History  Procedure Laterality Date  . Wisdom tooth extraction     Past Medical History  Diagnosis Date  . Gall stones   . Depression   . Suicidal  ideation   . Back pain    BP 146/86  Pulse 99  Resp 16  Ht 6' (1.829 m)  Wt 402 lb (182.346 kg)  BMI 54.51 kg/m2  SpO2 94%     Review of Systems  Gastrointestinal: Positive for nausea.  Genitourinary:       Bladder control problems  Musculoskeletal: Positive for gait problem.  Neurological:       Tingling  All other systems reviewed and are negative.       Objective:   Physical Exam  Morbidly obese male no acute distress Motor strength is 5/5 in bilateral lower extremity is Sensation is intact Deep tendon reflexes difficult to elicit at ankles but some of this may be secondary to poor relaxation. Patellar reflexes are normal Straight leg raising is negative Back has tenderness to palpation in the lumbar paraspinal muscles. There is pain with both flexion and extension lumbar spine as well as limited range of motion      Assessment & Plan:  1.  Lumbar spondylosis more the right side than on the left side with chronic pain. He is responded both medial branch blocks for short-term relief and about 6 months relief from radiofrequency neurotomy. You'll need a repeat next month. We will restart tramadol. 50 mg 3 times a day as needed We  discussed other treatment options. Given the fact that he does not have much in terms of sciatic symptoms do not think surgery would be helpful. Will also discussed bracing however I don't think this will be helpful. We discussed weight loss including a description of how trunkal obesity puts additional stress on the lumbar facet joints.Both patient and his wife plan to embark on a weight loss program.

## 2013-02-03 ENCOUNTER — Encounter: Payer: 59 | Attending: Physical Medicine & Rehabilitation

## 2013-02-03 ENCOUNTER — Ambulatory Visit (HOSPITAL_BASED_OUTPATIENT_CLINIC_OR_DEPARTMENT_OTHER): Payer: 59 | Admitting: Physical Medicine & Rehabilitation

## 2013-02-03 ENCOUNTER — Encounter: Payer: Self-pay | Admitting: Physical Medicine & Rehabilitation

## 2013-02-03 VITALS — BP 132/59 | HR 96 | Resp 14 | Ht 72.0 in | Wt >= 6400 oz

## 2013-02-03 DIAGNOSIS — Z79899 Other long term (current) drug therapy: Secondary | ICD-10-CM | POA: Insufficient documentation

## 2013-02-03 DIAGNOSIS — M47817 Spondylosis without myelopathy or radiculopathy, lumbosacral region: Secondary | ICD-10-CM | POA: Insufficient documentation

## 2013-02-03 DIAGNOSIS — G8929 Other chronic pain: Secondary | ICD-10-CM | POA: Insufficient documentation

## 2013-02-03 MED ORDER — GABAPENTIN 600 MG PO TABS: 600.0000 mg | ORAL_TABLET | Freq: Three times a day (TID) | ORAL | Status: DC

## 2013-02-03 NOTE — Progress Notes (Signed)
  PROCEDURE RECORD [facility]   Name: Scotti Kosta DOB:28-Sep-1980 MRN: [id number]  Date:02/03/2013  Physician: Claudette Laws, MD    Nurse/CMA: Shumaker RN  Allergies:  Allergies  Allergen Reactions  . Penicillins     Consent Signed: yes  Is patient diabetic? no  CBG today?  Pregnant: no LMP: No LMP for male patient. (age 34-55)  Anticoagulants: no Anti-inflammatory: no Antibiotics: no  Procedure: Right Radiofrequesncy Neurotomy Lumbar 2-3-4 Position: Prone Start Time:10:56  End Time: 11:13 Fluoro Time: 36 seconds  RN/CMA Designer, multimedia    Time 10:25 11:18    BP 132/59 151/73    Pulse 96 93    Respirations 14 14    O2 Sat 95 98    S/S 6 6    Pain Level 7/10 10/10     D/C home with wife, [name] & O X 3, D/C instructions reviewed, and sits independently.

## 2013-02-03 NOTE — Patient Instructions (Addendum)
Radiofrequency Lesioning Care After Refer to this sheet in the next few weeks. These instructions provide you with information on caring for yourself after your procedure. Your caregiver may also give you more specific instructions. Your treatment has been planned according to current medical practices, but problems sometimes occur. Call your caregiver if you have any problems or questions after your procedure. HOME CARE INSTRUCTIONS  Take pain medicine as directed by your caregiver.  You may have pain from the burned nerve for a while after the procedure. This takes time to heal. Ask your caregiver how long you should expect pain.  You should be able to return to your normal activities a couple of days after the procedure. When you can go back to work will depend on the type of work you do. Discuss this with your caregiver.  Pay close attention to how you feel after the procedure. If you start having pain, write down when it hurts and how it feels. This will help you and your caregiver know if you need another treatment. SEEK MEDICAL CARE IF:  The site where the needle was inserted for the procedure becomes red, swollen, or tender to touch.  Your pain does not get better. SEEK IMMEDIATE MEDICAL CARE IF:  You develop sudden, severe pain.  You develop numbness or tingling near the procedure site.  You have a fever. MAKE SURE YOU:  Understand these instructions.  Will watch your condition.  Will get help right away if you are not doing well or get worse. Document Released: 04/09/2011 Document Revised: 11/03/2011 Document Reviewed: 04/09/2011 [facility] Patient Information 2014 [place], [place].   Double up on Neurontin/Gabapentin dose today only

## 2013-02-03 NOTE — Progress Notes (Signed)
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  Informed consent was obtained after describing risks and benefits of the procedure with the patient, this includes bleeding, bruising, infection, paralysis and medication side effects. The patient wishes to proceed and has given written consent. The patient was placed in a prone position. The lumbar and sacral area was marked and prepped with Betadine. A 25-gauge 1-1/2 inch needle was inserted into the skin and subcutaneous tissue at 3 sites in one ML of 1% lidocaine was injected into each site. Then a 20-gauge 15cm cm radio frequency needle with a 1 cm curved active tip was inserted targeting the Right L3 SAP/transverse process jct. Bone contact was made and confirmed with lateral imaging. Sensory stimulation at 50 Hz followed by motor stimulation at 2 Hz confirm proper needle location followed by injection of one ML of the solution containing one ML of 4 mg per mL dexamethasone and 3 mL of 1% MPF lidocaine. Then the Right L5 SAP/transverse process junction was targeted. Bone contact was made and confirmed with lateral imaging. Sensory stimulation at 50 Hz followed by motor stimulation at 2 Hz confirm proper needle location followed by injection of one ML of the solution containing one ML of 4 mg per mL dexamethasone and 3 mL of 1% MPF lidocaine. Then the Right L4 SAP/transverse process junction was targeted. Bone contact was made and confirmed with lateral imaging. Sensory stimulation at 50 Hz followed by motor stimulation at 2 Hz confirm proper needle location followed by injection of one ML of the solution containing one ML of 4 mg per mL dexamethasone and 3 mL of 1% MPF lidocaine. Radio frequency lesion being at [facility] for 90 seconds was  performed. Needles were removed. Post procedure instructions and vital signs were performed. Patient tolerated procedure well. Followup appointment was given. And

## 2013-02-28 ENCOUNTER — Encounter (HOSPITAL_COMMUNITY): Payer: Self-pay | Admitting: Emergency Medicine

## 2013-02-28 ENCOUNTER — Emergency Department (INDEPENDENT_AMBULATORY_CARE_PROVIDER_SITE_OTHER)
Admission: EM | Admit: 2013-02-28 | Discharge: 2013-02-28 | Disposition: A | Discharge status: Home / Self Care | Payer: BC Managed Care – PPO | Source: Home / Self Care

## 2013-02-28 DIAGNOSIS — J020 Streptococcal pharyngitis: Secondary | ICD-10-CM

## 2013-02-28 MED ORDER — AZITHROMYCIN 250 MG PO TABS: ORAL_TABLET | ORAL | Status: DC

## 2013-02-28 MED ORDER — METHYLPREDNISOLONE ACETATE 40 MG/ML IJ SUSP
INTRAMUSCULAR | Status: AC
  Filled 2013-02-28: qty 5

## 2013-02-28 MED ORDER — METHYLPREDNISOLONE ACETATE 40 MG/ML IJ SUSP
20.0000 mg | Freq: Once | INTRAMUSCULAR | Status: AC
  Administered 2013-02-28: 20 mg via INTRAMUSCULAR

## 2013-02-28 NOTE — ED Notes (Signed)
Pt c/o sore throat onset yesterday. Feels tired and like lymphnodes are swollen. Has a lot of neck pain. Took Advil this morning with no relief of symptoms. Right side of neck is in more painful. Hurts to swallow and feels like airway is slightly constricted. Pt is alert and oriented.

## 2013-03-04 NOTE — ED Provider Notes (Signed)
History    CSN: [id number] Arrival date & time 02/28/13  1045  None    Chief Complaint  Patient presents with  . Sore Throat   (Consider location/radiation/quality/duration/timing/severity/associated sxs/prior Treatment) HPI   32 yo male comes in with sore throat that started yesterday.  Painful to swallow. Symptoms were sudden onset.  Feels tired with swollen lymph nodes.  No difficulty breathing.  Some chills.  Denies chest pain, sob, headaches.   Past Medical History  Diagnosis Date  . Gall stones   . Depression   . Suicidal ideation   . Back pain    Past Surgical History  Procedure Laterality Date  . Wisdom tooth extraction     Family History  Problem Relation Age of Onset  . Diabetes Father    History  Substance Use Topics  . Smoking status: Former Smoker    Types: Cigarettes    Quit date: 06/14/2012  . Smokeless tobacco: Not on file  . Alcohol Use: Yes     Comment: occ    Review of Systems  Constitutional: Positive for chills, appetite change and fatigue.  HENT: Positive for neck pain. Negative for hearing loss, ear pain, congestion, rhinorrhea, sneezing, neck stiffness, postnasal drip and ear discharge.   Eyes: Negative.   Respiratory: Negative.   Cardiovascular: Negative.   Gastrointestinal: Negative.   Endocrine: Negative.   Genitourinary: Negative.   Neurological: Negative.   Psychiatric/Behavioral: Negative.     Allergies  Penicillins  Home Medications   Current Outpatient Rx  Name  Route  Sig  Dispense  Refill  . azithromycin (ZITHROMAX) 250 MG tablet      5 day dose pack.  Take as directed.   6 tablet   0   . estradiol (ESTRACE) 2 MG tablet   Oral   Take 2 mg by mouth 2 (two) times daily.          Marland Kitchen gabapentin (NEURONTIN) 600 MG tablet   Oral   Take 1 tablet (600 mg total) by mouth 3 (three) times daily.   3 tablet   0   . HYDROcodone-acetaminophen (NORCO/VICODIN) 5-325 MG per tablet               . methocarbamol  (ROBAXIN) 500 MG tablet   Oral   Take 500 mg by mouth 4 (four) times daily.          Marland Kitchen spironolactone (ALDACTONE) 100 MG tablet               . traMADol (ULTRAM) 50 MG tablet   Oral   Take 1 tablet (50 mg total) by mouth every 6 (six) hours as needed for pain.   90 tablet   5    BP 134/70  Pulse 88  Temp(Src) 98.3 F (36.8 C) (Oral)  Resp 16  SpO2 100% Physical Exam  Constitutional: He is oriented to person, place, and time. He appears well-developed and well-nourished.  HENT:  Head: Normocephalic and atraumatic.  Right Ear: External ear normal.  Left Ear: External ear normal.  Mouth/Throat: Oropharyngeal exudate (red) present.  Eyes: EOM are normal. Pupils are equal, round, and reactive to light.  Neck: Normal range of motion.  Cardiovascular: Normal rate and regular rhythm.   Pulmonary/Chest: Effort normal and breath sounds normal. No respiratory distress.  Abdominal: Soft. Bowel sounds are normal.  Musculoskeletal: Normal range of motion.  Lymphadenopathy:    He has cervical adenopathy.  Neurological: He is alert and oriented to person, place, and  time.  Skin: Skin is warm and dry.  Psychiatric: He has a normal mood and affect.    ED Course  Procedures (including critical care time) Labs Reviewed  POCT RAPID STREP A ([facility] CARE ONLY) - Abnormal; Notable for the following:    Streptococcus, Group A Screen (Direct) POSITIVE (*)    All other components within normal limits   No results found. 1. Strep throat     MDM  Patient allergic to penicillin.  Will treat with azithromycin.  F/u in 2-3 day if symptoms not improved or go to ED if worsens.  All questions answered.      Meds ordered this encounter  Medications  . methylPREDNISolone acetate (DEPO-MEDROL) injection 20 mg    Sig:   . azithromycin (ZITHROMAX) 250 MG tablet    Sig: 5 day dose pack.  Take as directed.    Dispense:  6 tablet    Refill:  0    Zonia Kief, PA-C 03/04/13 1433

## 2013-03-06 NOTE — ED Provider Notes (Signed)
Medical screening examination/treatment/procedure(s) were performed by non-physician practitioner and as supervising physician I was immediately available for consultation/collaboration.  Leslee Home, M.D.  Reuben Likes, MD 03/06/13 [phone]

## 2013-03-18 ENCOUNTER — Ambulatory Visit: Payer: BC Managed Care – PPO | Admitting: Physical Medicine & Rehabilitation

## 2013-03-18 ENCOUNTER — Encounter: Payer: BC Managed Care – PPO | Attending: Physical Medicine & Rehabilitation

## 2013-03-18 DIAGNOSIS — M47817 Spondylosis without myelopathy or radiculopathy, lumbosacral region: Secondary | ICD-10-CM | POA: Insufficient documentation

## 2013-03-18 DIAGNOSIS — G8929 Other chronic pain: Secondary | ICD-10-CM | POA: Insufficient documentation

## 2013-03-18 DIAGNOSIS — Z79899 Other long term (current) drug therapy: Secondary | ICD-10-CM | POA: Insufficient documentation

## 2013-05-09 ENCOUNTER — Other Ambulatory Visit: Payer: Self-pay

## 2013-05-09 MED ORDER — TRAMADOL HCL 50 MG PO TABS: 50.0000 mg | ORAL_TABLET | Freq: Four times a day (QID) | ORAL | Status: DC | PRN

## 2013-05-27 ENCOUNTER — Encounter (HOSPITAL_COMMUNITY): Payer: Self-pay | Admitting: Emergency Medicine

## 2013-05-27 ENCOUNTER — Emergency Department (HOSPITAL_COMMUNITY)
Admission: EM | Admit: 2013-05-27 | Discharge: 2013-05-27 | Disposition: A | Discharge status: Home / Self Care | Payer: BC Managed Care – PPO | Attending: Emergency Medicine | Admitting: Emergency Medicine

## 2013-05-27 ENCOUNTER — Emergency Department (HOSPITAL_COMMUNITY): Payer: BC Managed Care – PPO

## 2013-05-27 DIAGNOSIS — F329 Major depressive disorder, single episode, unspecified: Secondary | ICD-10-CM | POA: Insufficient documentation

## 2013-05-27 DIAGNOSIS — R0682 Tachypnea, not elsewhere classified: Secondary | ICD-10-CM | POA: Insufficient documentation

## 2013-05-27 DIAGNOSIS — Z8739 Personal history of other diseases of the musculoskeletal system and connective tissue: Secondary | ICD-10-CM | POA: Insufficient documentation

## 2013-05-27 DIAGNOSIS — F3289 Other specified depressive episodes: Secondary | ICD-10-CM | POA: Insufficient documentation

## 2013-05-27 DIAGNOSIS — Z87891 Personal history of nicotine dependence: Secondary | ICD-10-CM | POA: Insufficient documentation

## 2013-05-27 DIAGNOSIS — J069 Acute upper respiratory infection, unspecified: Secondary | ICD-10-CM | POA: Diagnosis present

## 2013-05-27 DIAGNOSIS — Z8719 Personal history of other diseases of the digestive system: Secondary | ICD-10-CM | POA: Insufficient documentation

## 2013-05-27 DIAGNOSIS — Z79899 Other long term (current) drug therapy: Secondary | ICD-10-CM | POA: Insufficient documentation

## 2013-05-27 DIAGNOSIS — R000 Tachycardia, unspecified: Secondary | ICD-10-CM | POA: Insufficient documentation

## 2013-05-27 DIAGNOSIS — R0602 Shortness of breath: Secondary | ICD-10-CM | POA: Diagnosis present

## 2013-05-27 LAB — COMPREHENSIVE METABOLIC PANEL
AST: 20 U/L (ref 0–37)
CO2: 22 mEq/L (ref 19–32)
Calcium: 9.6 mg/dL (ref 8.4–10.5)
Creatinine, Ser: 0.86 mg/dL (ref 0.50–1.35)
GFR calc Af Amer: >90 mL/min (ref >90)
GFR calc non Af Amer: >90 mL/min (ref >90)
Glucose, Bld: 117 mg/dL — ABNORMAL HIGH (ref 70–99)
Total Protein: 7.6 g/dL (ref 6.0–8.3)

## 2013-05-27 LAB — CBC WITH DIFFERENTIAL/PLATELET
Basophils Absolute: 0 10*3/uL (ref 0.0–0.1)
Eosinophils Absolute: 0.6 10*3/uL (ref 0.0–0.7)
Eosinophils Relative: 4 % (ref 0–5)
HCT: 43.8 % (ref 39.0–52.0)
Lymphocytes Relative: 13 % (ref 12–46)
MCH: 29.1 pg (ref 26.0–34.0)
MCV: 86.6 fL (ref 78.0–100.0)
Monocytes Absolute: 1.2 10*3/uL — ABNORMAL HIGH (ref 0.1–1.0)
Platelets: 311 10*3/uL (ref 150–400)
RDW: 13.4 % (ref 11.5–15.5)
WBC: 13.5 10*3/uL — ABNORMAL HIGH (ref 4.0–10.5)

## 2013-05-27 LAB — BLOOD GAS, ARTERIAL
Acid-base deficit: 2.2 mmol/L — ABNORMAL HIGH (ref 0.0–2.0)
Drawn by: 317871
O2 Content: 2 L/min
O2 Saturation: 93.1 %
Patient temperature: 98.6
TCO2: 18.8 mmol/L (ref 0–100)
pCO2 arterial: 34.6 mmHg — ABNORMAL LOW (ref 35.0–45.0)

## 2013-05-27 LAB — RAPID URINE DRUG SCREEN, HOSP PERFORMED
Amphetamines: NOT DETECTED
Benzodiazepines: NOT DETECTED
Cocaine: NOT DETECTED

## 2013-05-27 MED ORDER — ACETAMINOPHEN 325 MG PO TABS
650.0000 mg | ORAL_TABLET | Freq: Once | ORAL | Status: AC
  Administered 2013-05-27: 650 mg via ORAL
  Filled 2013-05-27: qty 2

## 2013-05-27 MED ORDER — IPRATROPIUM BROMIDE 0.02 % IN SOLN
0.5000 mg | Freq: Once | RESPIRATORY_TRACT | Status: AC
  Administered 2013-05-27: 0.5 mg via RESPIRATORY_TRACT
  Filled 2013-05-27: qty 2.5

## 2013-05-27 MED ORDER — ALBUTEROL SULFATE HFA 108 (90 BASE) MCG/ACT IN AERS: 2.0000 | INHALATION_SPRAY | Freq: Four times a day (QID) | RESPIRATORY_TRACT | Status: DC | PRN

## 2013-05-27 MED ORDER — ALBUTEROL SULFATE (5 MG/ML) 0.5% IN NEBU
5.0000 mg | INHALATION_SOLUTION | Freq: Once | RESPIRATORY_TRACT | Status: AC
  Administered 2013-05-27: 5 mg via RESPIRATORY_TRACT
  Filled 2013-05-27: qty 1

## 2013-05-27 MED ORDER — MORPHINE SULFATE 4 MG/ML IJ SOLN
4.0000 mg | Freq: Once | INTRAMUSCULAR | Status: AC
  Administered 2013-05-27: 4 mg via INTRAVENOUS
  Filled 2013-05-27: qty 1

## 2013-05-27 MED ORDER — AZITHROMYCIN 250 MG PO TABS: ORAL_TABLET | ORAL | Status: DC

## 2013-05-27 MED ORDER — SODIUM CHLORIDE 0.9 % IV BOLUS (SEPSIS)
1000.0000 mL | Freq: Once | INTRAVENOUS | Status: AC
  Administered 2013-05-27: 1000 mL via INTRAVENOUS

## 2013-05-27 MED ORDER — DIPHENHYDRAMINE HCL 50 MG/ML IJ SOLN
12.5000 mg | Freq: Once | INTRAMUSCULAR | Status: AC
  Administered 2013-05-27: 12.5 mg via INTRAVENOUS
  Filled 2013-05-27: qty 1

## 2013-05-27 MED ORDER — METOCLOPRAMIDE HCL 5 MG/ML IJ SOLN
5.0000 mg | Freq: Once | INTRAMUSCULAR | Status: AC
  Administered 2013-05-27: 5 mg via INTRAVENOUS
  Filled 2013-05-27: qty 2

## 2013-05-27 MED ORDER — SODIUM CHLORIDE 0.9 % IV BOLUS (SEPSIS)
1000.0000 mL | INTRAVENOUS | Status: AC
  Administered 2013-05-27: 1000 mL via INTRAVENOUS

## 2013-05-27 NOTE — ED Notes (Signed)
I-stat Lac Acid given to Dr. Anitra Lauth.

## 2013-05-27 NOTE — ED Notes (Signed)
Pt placed on RA

## 2013-05-27 NOTE — ED Notes (Signed)
EKG old and new given to EDP, Anitra Lauth, MD.

## 2013-05-27 NOTE — ED Notes (Signed)
Pt placed on 2lpm [place] d/t O2 sat of 90 on RA.

## 2013-05-27 NOTE — ED Provider Notes (Signed)
6:58 AM Accepted care from Dr. Anitra Lauth. 7M here w/ cough and sob. Labs thus far non-contrib. Suspect bronchitis. Pt getting tylenol and IVF.  9:30 AM: Pt feeling better, HR has dec.  I have discussed the diagnosis/risks/treatment options with the patient and believe the pt to be eligible for discharge home to follow-up with pcp as needed. We also discussed returning to the ED immediately if new or worsening sx occur. We discussed the sx which are most concerning (e.g., sob, cp) that necessitate immediate return. Any new prescriptions provided to the patient are listed below.  Discharge Medication List as of 05/27/2013  9:32 AM    START taking these medications   Details  albuterol (PROVENTIL HFA;VENTOLIN HFA) 108 (90 BASE) MCG/ACT inhaler Inhale 2 puffs into the lungs every 6 (six) hours as needed for wheezing., Starting 05/27/2013, Until Discontinued, Print    azithromycin (ZITHROMAX Z-PAK) 250 MG tablet Take 2 tablets by mouth on day 1. Take 1 tablet by mouth on days 2-5., Print         Junius Argyle, MD 05/27/13 [phone]

## 2013-05-27 NOTE — ED Notes (Signed)
Pt states he had a sore throat yesterday, now is having severe shortness of breath.

## 2013-05-27 NOTE — ED Provider Notes (Signed)
CSN: [id number]     Arrival date & time 05/27/13  0540 History   First MD Initiated Contact with Patient 05/27/13 0554     No chief complaint on file.  (Consider location/radiation/quality/duration/timing/severity/associated sxs/prior Treatment) HPI Comments: Pt presents by POV and almost collapses out in the waiting room c/o of SOB and cough.  Unclear how long SOB has been going on.  Pt denies N/V or abd pain.  He appear somewhat sleepy but arousable  Patient is a 32 y.o. male presenting with shortness of breath. The history is provided by the patient. The history is limited by the condition of the patient.  Shortness of Breath   Past Medical History  Diagnosis Date  . Gall stones   . Depression   . Suicidal ideation   . Back pain    Past Surgical History  Procedure Laterality Date  . Wisdom tooth extraction     Family History  Problem Relation Age of Onset  . Diabetes Father    History  Substance Use Topics  . Smoking status: Former Smoker    Types: Cigarettes    Quit date: 06/14/2012  . Smokeless tobacco: Not on file  . Alcohol Use: Yes     Comment: occ    Review of Systems  Unable to perform ROS Respiratory: Positive for shortness of breath.     Allergies  Penicillins  Home Medications   Current Outpatient Rx  Name  Route  Sig  Dispense  Refill  . estradiol (ESTRACE) 2 MG tablet   Oral   Take 2 mg by mouth 2 (two) times daily.          Marland Kitchen gabapentin (NEURONTIN) 600 MG tablet   Oral   Take 1 tablet (600 mg total) by mouth 3 (three) times daily.   3 tablet   0   . HYDROcodone-acetaminophen (NORCO/VICODIN) 5-325 MG per tablet               . methocarbamol (ROBAXIN) 500 MG tablet   Oral   Take 500 mg by mouth 4 (four) times daily.          Marland Kitchen spironolactone (ALDACTONE) 100 MG tablet               . traMADol (ULTRAM) 50 MG tablet   Oral   Take 1 tablet (50 mg total) by mouth every 6 (six) hours as needed for pain.   90 tablet   0    Must last 30 days.    Pulse 115  Temp(Src) 100.1 F (37.8 C) (Oral) Physical Exam  Nursing note and vitals reviewed. Constitutional: He appears well-developed and well-nourished. He appears distressed.  Sleepy but will arouse to voice  HENT:  Head: Normocephalic and atraumatic.  Mouth/Throat: Oropharynx is clear and moist.  Eyes: Conjunctivae and EOM are normal. Pupils are equal, round, and reactive to light.  Neck: Normal range of motion. Neck supple. No tracheal tenderness present.  Cardiovascular: Regular rhythm and intact distal pulses.  Tachycardia present.   No murmur heard. Pulmonary/Chest: Breath sounds normal. No stridor. Tachypnea noted. No respiratory distress. He has no wheezes. He has no rales. He exhibits no tenderness.  Abdominal: Soft. He exhibits no distension. There is no tenderness. There is no rebound and no guarding.  Musculoskeletal: Normal range of motion. He exhibits no edema and no tenderness.  Neurological:  Eyes are closed and with physical stimuli he will open his eyes and answer yes and no questions  Skin: Skin is  warm and dry. No rash noted. He is not diaphoretic. No erythema.  Psychiatric: He has a normal mood and affect. His behavior is normal.    ED Course  Procedures (including critical care time) Labs Review Labs Reviewed  CBC WITH DIFFERENTIAL - Abnormal; Notable for the following:    WBC 13.5 (*)    Neutro Abs 10.1 (*)    Monocytes Absolute 1.2 (*)    All other components within normal limits  COMPREHENSIVE METABOLIC PANEL - Abnormal; Notable for the following:    Glucose, Bld 117 (*)    Total Bilirubin 0.2 (*)    All other components within normal limits  BLOOD GAS, ARTERIAL - Abnormal; Notable for the following:    pCO2 arterial 34.6 (*)    pO2, Arterial 66.9 (*)    Acid-base deficit 2.2 (*)    All other components within normal limits  D-DIMER, QUANTITATIVE  URINE RAPID DRUG SCREEN (HOSP PERFORMED)  ETHANOL  CG4 I-STAT (LACTIC  ACID)   Imaging Review Dg Chest Port 1 View  05/27/2013   *RADIOLOGY REPORT*  Clinical Data: Shortness of breath  PORTABLE CHEST - 1 VIEW  Comparison: Prior radiograph from 06/16/2012  Findings: Cardiac and mediastinal silhouettes are stable in size and contour, and remain within normal limits.  Lungs are normally inflated.  No airspace consolidation, pulmonary edema, pleural effusion, or pneumothorax is identified.  There is no acute osseous abnormality.  Soft tissues are within normal limits.  IMPRESSION: No acute cardiopulmonary process.   Original Report Authenticated By: Rise Mu, M.D.    Date: 05/27/2013  Rate: 106  Rhythm: sinus tachycardia  QRS Axis: normal  Intervals: normal  ST/T Wave abnormalities: normal  Conduction Disutrbances:none  Narrative Interpretation:   Old EKG Reviewed: unchanged   MDM  No diagnosis found.  Patient presents by private vehicle for shortness of breath. He got out of the car and had difficulty making it into the ER. Patient states that he became short of breath tonight but had a cough. Patient is a poor historian and only says 1-2 words at a time and is somewhat sleepy.  Patient is tachycardic and tachypneic satting 93% on room air. No prior history of asthma. Quit smoking approximately one year ago.  Patient has clear breath sounds bilaterally. Concern for possible infectious etiology versus PE. EKG shows a sinus tachycardia but otherwise no significant findings.  We'll try albuterol and Atrovent to see if any symptom improvement. CBC, CMP, and UDS, EtOH, d-dimer, ABG,lactate and CXR pending.  6:29 AM Other than mild leukocytosis of 13,000 rest of labs wnl.  CXR without acute findings.  ABG wnl. D-dimer wnl.  AT this point low suspicion for PE or cardiac pathology.  Feel most likely resp pathology.  6:50 AM Pt now saying his right side is hurting.  SOB has resolved after albuterol/atrovent but pain with deep breathing.  Pt just received  tylenol but will also do morphine for pain control as pt appears very uncomfortable.  Gwyneth Sprout, MD 05/27/13 [phone]

## 2013-11-23 ENCOUNTER — Emergency Department (HOSPITAL_COMMUNITY)
Admission: EM | Admit: 2013-11-23 | Discharge: 2013-11-23 | Disposition: A | Discharge status: Home / Self Care | Payer: BC Managed Care – PPO | Source: Home / Self Care | Attending: Family Medicine | Admitting: Family Medicine

## 2013-11-23 ENCOUNTER — Encounter (HOSPITAL_COMMUNITY): Payer: Self-pay | Admitting: Emergency Medicine

## 2013-11-23 DIAGNOSIS — M545 Low back pain, unspecified: Secondary | ICD-10-CM

## 2013-11-23 MED ORDER — PREDNISONE 10 MG PO KIT: PACK | ORAL | Status: DC

## 2013-11-23 MED ORDER — TRAMADOL HCL 50 MG PO TABS: 50.0000 mg | ORAL_TABLET | Freq: Four times a day (QID) | ORAL | Status: DC | PRN

## 2013-11-23 NOTE — Discharge Instructions (Signed)
Thank you for coming in today. Take prednisone as directed.  Use tramadol for severe pain.  Follow up with your pain doctor.  Come back or go to the emergency room if you notice new weakness new numbness problems walking or bowel or bladder problems.  Back Injury Prevention Back injuries can be extremely painful and difficult to heal. After having one back injury, you are much more likely to experience another later on. It is important to learn how to avoid injuring or re-injuring your back. The following tips can help you to prevent a back injury. PHYSICAL FITNESS  Exercise regularly and try to develop good tone in your abdominal muscles. Your abdominal muscles provide a lot of the support needed by your back.  Do aerobic exercises (walking, jogging, biking, swimming) regularly.  Do exercises that increase balance and strength (tai chi, yoga) regularly. This can decrease your risk of falling and injuring your back.  Stretch before and after exercising.  Maintain a healthy weight. The more you weigh, the more stress is placed on your back. For every pound of weight, 10 times that amount of pressure is placed on the back. DIET  Talk to your caregiver about how much calcium and vitamin D you need per day. These nutrients help to prevent weakening of the bones (osteoporosis). Osteoporosis can cause broken (fractured) bones that lead to back pain.  Include good sources of calcium in your diet, such as dairy products, green, leafy vegetables, and products with calcium added (fortified).  Include good sources of vitamin D in your diet, such as milk and foods that are fortified with vitamin D.  Consider taking a nutritional supplement or a multivitamin if needed.  Stop smoking if you smoke. POSTURE  Sit and stand up straight. Avoid leaning forward when you sit or hunching over when you stand.  Choose chairs with good low back (lumbar) support.  If you work at a desk, sit close to your  work so you do not need to lean over. Keep your chin tucked in. Keep your neck drawn back and elbows bent at a right angle. Your arms should look like the letter "L."  Sit high and close to the steering wheel when you drive. Add a lumbar support to your car seat if needed.  Avoid sitting or standing in one position for too long. Take breaks to get up, stretch, and walk around at least once every hour. Take breaks if you are driving for long periods of time.  Sleep on your side with your knees slightly bent, or sleep on your back with a pillow under your knees. Do not sleep on your stomach. LIFTING, TWISTING, AND REACHING  Avoid heavy lifting, especially repetitive lifting. If you must do heavy lifting:  Stretch before lifting.  Work slowly.  Rest between lifts.  Use carts and dollies to move objects when possible.  Make several small trips instead of carrying 1 heavy load.  Ask for help when you need it.  Ask for help when moving big, awkward objects.  Follow these steps when lifting:  Stand with your feet shoulder-width apart.  Get as close to the object as you can. Do not try to pick up heavy objects that are far from your body.  Use handles or lifting straps if they are available.  Bend at your knees. Squat down, but keep your heels off the floor.  Keep your shoulders pulled back, your chin tucked in, and your back straight.  Lift the object  slowly, tightening the muscles in your legs, abdomen, and buttocks. Keep the object as close to the center of your body as possible.  When you put a load down, use these same guidelines in reverse.  Do not:  Lift the object above your waist.  Twist at the waist while lifting or carrying a load. Move your feet if you need to turn, not your waist.  Bend over without bending at your knees.  Avoid reaching over your head, across a table, or for an object on a high surface. OTHER TIPS  Avoid wet floors and keep sidewalks clear of  ice to prevent falls.  Do not sleep on a mattress that is too soft or too hard.  Keep items that are used frequently within easy reach.  Put heavier objects on shelves at waist level and lighter objects on lower or higher shelves.  Find ways to decrease your stress, such as exercise, massage, or relaxation techniques. Stress can build up in your muscles. Tense muscles are more vulnerable to injury.  Seek treatment for depression or anxiety if needed. These conditions can increase your risk of developing back pain. SEEK MEDICAL CARE IF:  You injure your back.  You have questions about diet, exercise, or other ways to prevent back injuries. MAKE SURE YOU:  Understand these instructions.  Will watch your condition.  Will get help right away if you are not doing well or get worse. Document Released: 09/18/2004 Document Revised: 11/03/2011 Document Reviewed: 09/22/2011 [facility] Patient Information 2014 [place], [place].

## 2013-11-23 NOTE — ED Notes (Signed)
C/o back pain and sore throat  States he picked up a bottle of water on Sunday and trigger his back pain States he has a sore throat since Sunday

## 2013-11-23 NOTE — ED Provider Notes (Signed)
Mark Rice is a 33 y.o. male who presents to Urgent Care today for  1) sore throat: Patient has experienced 3 days of sore throat associated with congestion and chills. Notes significant nausea vomiting diarrhea or cough. Patient has tried Tylenol cough and cold which has helped some. He denies any fevers chest pains palpitations or shortness of breath.  2) additionally patient notes low back pain he into the left side. This is also been present for 3 days it occurred after squatting down to pick something up. Is an extensive history of back pain has been well evaluated. He denies any significant weakness or numbness. He does not some urine dribbling which is typical for him during back pain flares. None of the symptoms are worse than they have been in the past. He is an appointment with a PMR pain management doctor next week.    Past Medical History  Diagnosis Date  . Gall stones   . Depression   . Suicidal ideation   . Back pain    History  Substance Use Topics  . Smoking status: Former Smoker    Types: Cigarettes    Quit date: 06/14/2012  . Smokeless tobacco: Not on file  . Alcohol Use: Yes     Comment: occ   ROS as above Medications: No current facility-administered medications for this encounter.   Current Outpatient Prescriptions  Medication Sig Dispense Refill  . albuterol (PROVENTIL HFA;VENTOLIN HFA) 108 (90 BASE) MCG/ACT inhaler Inhale 2 puffs into the lungs every 6 (six) hours as needed for wheezing.  1 Inhaler  0  . ibuprofen (ADVIL,MOTRIN) 200 MG tablet Take 200 mg by mouth every 6 (six) hours as needed for pain.      . PredniSONE 10 MG KIT 12 day dose pack po  1 kit  0  . traMADol (ULTRAM) 50 MG tablet Take 1 tablet (50 mg total) by mouth every 6 (six) hours as needed.  15 tablet  0    Exam:  BP 130/72  Pulse 94  Temp(Src) 98.2 F (36.8 C) (Oral)  Resp 12  SpO2 99% Gen: Well NAD, morbidly obese HEENT: EOMI,  MMM, posterior pharynx with erythema and  cobblestoning Lungs: Normal work of breathing. CTABL Heart: RRR no MRG Abd: NABS, Soft. NT, ND Exts: Brisk capillary refill, warm and well perfused.  Back: Nontender to spinal midline Reflexes are diminished but equal bilaterally Strength is intact Normal gait Sensation intact throughout  No results found for this or any previous visit (from the past 24 hour(s)). No results found.  Assessment and Plan: 33 y.o. male with  1) viral URI. Plan for symptomatic management with prednisone. Continue over-the-counter medications as needed 2) back pain: With sciatica. Note significant neurological deficit currently. Patient has history of some bladder issues with back pain episodes. This seems to be stable and consistent for him. I'm somewhat concerned. Fortunately he is an appointment with a PMNR doctor soon. Discussed in details precautions. He may benefit from repeat MRI sooner otherwise.  However his weight is an issue here. He may not be a candidate for back surgery due to his significant morbid obesity.   Discussed warning signs or symptoms. Please see discharge instructions. Patient expresses understanding.  '  Gregor Hams, MD 11/23/13 1026

## 2013-11-25 ENCOUNTER — Telehealth (HOSPITAL_COMMUNITY): Payer: Self-pay | Admitting: Family Medicine

## 2013-11-25 LAB — CULTURE, GROUP A STREP

## 2013-11-25 MED ORDER — CLINDAMYCIN HCL 300 MG PO CAPS: 300.0000 mg | ORAL_CAPSULE | Freq: Three times a day (TID) | ORAL | Status: DC

## 2013-11-25 NOTE — ED Notes (Signed)
STREP positive. Clinda called in.  RN to call pt.  Rodolph BongEvan S Corey, MD 11/25/13 2256

## 2013-11-26 NOTE — ED Notes (Signed)
I called pt. Pt. verified x 2 and given result.  Pt. told he needs Clindamycin for strep throat and where to pick up the Rx. Instructed to take all the medication and get rechecked if not better.  Instructed if anyone he exposed gets the same symptoms should get checked for strep as well. Pt. voiced understanding. Vassie MoselleYork, Onalee Steinbach M 11/26/2013

## 2013-11-30 ENCOUNTER — Ambulatory Visit: Payer: Self-pay | Admitting: Medical

## 2013-12-02 ENCOUNTER — Ambulatory Visit (HOSPITAL_BASED_OUTPATIENT_CLINIC_OR_DEPARTMENT_OTHER): Payer: BC Managed Care – PPO | Admitting: Physical Medicine & Rehabilitation

## 2013-12-02 ENCOUNTER — Encounter: Payer: Self-pay | Admitting: Physical Medicine & Rehabilitation

## 2013-12-02 ENCOUNTER — Encounter: Payer: BC Managed Care – PPO | Attending: Physical Medicine & Rehabilitation

## 2013-12-02 VITALS — BP 150/95 | HR 120 | Resp 17 | Ht 72.0 in | Wt >= 6400 oz

## 2013-12-02 DIAGNOSIS — G8929 Other chronic pain: Secondary | ICD-10-CM | POA: Insufficient documentation

## 2013-12-02 DIAGNOSIS — M47817 Spondylosis without myelopathy or radiculopathy, lumbosacral region: Secondary | ICD-10-CM

## 2013-12-02 MED ORDER — GABAPENTIN 100 MG PO CAPS: 100.0000 mg | ORAL_CAPSULE | Freq: Three times a day (TID) | ORAL | Status: DC

## 2013-12-02 MED ORDER — TRAMADOL HCL 50 MG PO TABS: 50.0000 mg | ORAL_TABLET | Freq: Four times a day (QID) | ORAL | Status: DC | PRN

## 2013-12-02 MED ORDER — DIAZEPAM 10 MG PO TABS: 10.0000 mg | ORAL_TABLET | Freq: Once | ORAL | Status: DC

## 2013-12-02 NOTE — Patient Instructions (Signed)
Radiofrequency next visit

## 2013-12-02 NOTE — Progress Notes (Signed)
   Subjective:    Patient ID: Mark Rice, male    DOB: 1981-02-11, 33 y.o.   MRN: [id number]  HPI  Thu Feb 03, 2013 11:58 AM EDT  02/03/2013   RightL2., Right L4 and Right L3 medial branch radio frequency neurotomy under fluoroscopic guidance    Patient had good pain relief until December 2014. Patient does not recall whether or not he fell at that time Right side hurts worse than left side Pain Inventory Average Pain 8 Pain Right Now 8 My pain is sharp and aching  In the last 24 hours, has pain interfered with the following? General activity 7 Relation with others 7 Enjoyment of life 7 What TIME of day is your pain at its worst? na Sleep (in general) Poor  Pain is worse with: walking, bending, sitting and standing Pain improves with: medication Relief from Meds: na  Mobility walk without assistance how many minutes can you walk? 30 ability to climb steps?  yes do you drive?  yes Do you have any goals in this area?  yes  Function employed # of hrs/week 40 I need assistance with the following:  dressing  Neuro/Psych bladder control problems weakness numbness tingling trouble walking  Prior Studies Any changes since last visit?  no  Physicians involved in your care Any changes since last visit?  no   Family History  Problem Relation Age of Onset  . Diabetes Father    History   Social History  . Marital Status: Married    Spouse Name: N/A    Number of Children: N/A  . Years of Education: N/A   Social History Main Topics  . Smoking status: Former Smoker    Types: Cigarettes    Quit date: 06/14/2012  . Smokeless tobacco: None  . Alcohol Use: Yes     Comment: occ  . Drug Use: No  . Sexual Activity: None   Other Topics Concern  . None   Social History Narrative  . None   Past Surgical History  Procedure Laterality Date  . Wisdom tooth extraction     Past Medical History  Diagnosis Date  . Gall stones   . Depression   . Suicidal  ideation   . Back pain    BP 150/95  Pulse 120  Resp 17  Ht 6' (1.829 m)  Wt 414 lb (187.789 kg)  BMI 56.14 kg/m2  SpO2 96%  Opioid Risk Score:   Fall Risk Score:     Review of Systems  Genitourinary: Positive for difficulty urinating.  Musculoskeletal: Positive for gait problem.  Neurological: Positive for weakness and numbness.       Tingling   All other systems reviewed and are negative.      Objective:   Physical Exam  Lumbar paraspinal tenderness starting at L3-S1 Pain with flexion extension and lateral bending Negative straight leg raising test 5/5 strength bilateral hip flexors knee extensors ankle dorsiflexors and plantar flexors      Assessment & Plan:  1. Lumbar spondylosis with chronic pain, generally does well without narcotic analgesics but once his RF wears off he requires some tramadol Tramadol 50 mg 4 times a day  gabapentin 100 mg 3 times a day  Schedule for repeat L2-L3 L4 RF right side next month

## 2014-01-12 ENCOUNTER — Encounter: Payer: Self-pay | Admitting: Physical Medicine & Rehabilitation

## 2014-01-12 ENCOUNTER — Encounter: Payer: BC Managed Care – PPO | Attending: Physical Medicine & Rehabilitation

## 2014-01-12 ENCOUNTER — Ambulatory Visit (HOSPITAL_BASED_OUTPATIENT_CLINIC_OR_DEPARTMENT_OTHER): Payer: BC Managed Care – PPO | Admitting: Physical Medicine & Rehabilitation

## 2014-01-12 VITALS — BP 158/95 | HR 98 | Resp 14 | Ht 72.0 in | Wt >= 6400 oz

## 2014-01-12 DIAGNOSIS — G8929 Other chronic pain: Secondary | ICD-10-CM | POA: Insufficient documentation

## 2014-01-12 DIAGNOSIS — M47817 Spondylosis without myelopathy or radiculopathy, lumbosacral region: Secondary | ICD-10-CM

## 2014-01-12 MED ORDER — GABAPENTIN 100 MG PO CAPS: 100.0000 mg | ORAL_CAPSULE | Freq: Three times a day (TID) | ORAL | Status: DC

## 2014-01-12 MED ORDER — TRAMADOL HCL 50 MG PO TABS: 50.0000 mg | ORAL_TABLET | Freq: Four times a day (QID) | ORAL | Status: DC | PRN

## 2014-01-12 NOTE — Progress Notes (Signed)
  PROCEDURE RECORD [facility]   Name: Mark Rice DOB:05-Aug-1981 MRN: [id number]  Date:01/12/2014  Physician: Claudette LawsAndrew Kirsteins, MD    Nurse/CMA: Shumaker RN  Allergies:  Allergies  Allergen Reactions  . Penicillins Shortness Of Breath    Consent Signed: yes  Is patient diabetic? no  CBG today?   Pregnant: no LMP: No LMP for male patient. (age 35-55)  Anticoagulants: no Anti-inflammatory: no Antibiotics: no  Procedure: Right L2,3,4 Radiofrequency Position: Prone Start Time: 10:24  End Time: 10: 38  Fluoro Time:36 seconds  RN/CMA Walston CMA Shumaker RN    Time 1012 10:43    BP 158/95 147/86    Pulse 98 85    Respirations 16 16    O2 Sat 99 99    S/S 6 6    Pain Level 6/10 6/10     D/C home with wife, [name] & O X 3, D/C instructions reviewed, and sits independently.

## 2014-01-12 NOTE — Progress Notes (Signed)
RightL2., Right L4 and Right L3 medial branch radio frequency neurotomy under fluoroscopic guidance   Indication: Low back pain due to lumbar spondylosis which has been relieved on 2 occasions by greater than 50% by lumbar medial branch blocks at corresponding levels.6 month relief after RF at same levels last performed in July 2014  Informed consent was obtained after describing risks and benefits of the procedure with the patient, this includes bleeding, bruising, infection, paralysis and medication side effects. The patient wishes to proceed and has given written consent. The patient was placed in a prone position. The lumbar and sacral area was marked and prepped with Betadine. A 25-gauge 1-1/2 inch needle was inserted into the skin and subcutaneous tissue at 3 sites in one ML of 1% lidocaine was injected into each site. Then a 20-gauge 15cm cm radio frequency needle with a 1 cm curved active tip was inserted targeting the Right L3 SAP/transverse process jct. Bone contact was made and confirmed with lateral imaging. Sensory stimulation at 50 Hz followed by motor stimulation at 2 Hz confirm proper needle location followed by injection of one ML of the solution containing one ML of 4 mg per mL dexamethasone and 3 mL of 1% MPF lidocaine. Then the Right L5 SAP/transverse process junction was targeted. Bone contact was made and confirmed with lateral imaging. Sensory stimulation at 50 Hz followed by motor stimulation at 2 Hz confirm proper needle location followed by injection of one ML of the solution containing one ML of 4 mg per mL dexamethasone and 3 mL of 1% MPF lidocaine. Then the Right L4 SAP/transverse process junction was targeted. Bone contact was made and confirmed with lateral imaging. Sensory stimulation at 50 Hz followed by motor stimulation at 2 Hz confirm proper needle location followed by injection of one ML of the solution containing one ML of 4 mg per mL dexamethasone and 3 mL of 1% MPF  lidocaine. Radio frequency lesion being at [facility]80C for 90 seconds was performed. Needles were removed. Post procedure instructions and vital signs were performed. Patient tolerated procedure well. Followup appointment was given. And

## 2014-01-12 NOTE — Patient Instructions (Signed)

## 2014-01-27 ENCOUNTER — Encounter: Payer: Self-pay | Admitting: Physical Medicine & Rehabilitation

## 2014-02-16 ENCOUNTER — Encounter: Payer: Self-pay | Admitting: Physical Medicine & Rehabilitation

## 2014-02-16 ENCOUNTER — Ambulatory Visit (HOSPITAL_BASED_OUTPATIENT_CLINIC_OR_DEPARTMENT_OTHER): Payer: BC Managed Care – PPO | Admitting: Physical Medicine & Rehabilitation

## 2014-02-16 ENCOUNTER — Encounter: Payer: BC Managed Care – PPO | Attending: Physical Medicine & Rehabilitation

## 2014-02-16 VITALS — BP 155/79 | HR 90 | Resp 14 | Ht 72.0 in | Wt >= 6400 oz

## 2014-02-16 DIAGNOSIS — M47817 Spondylosis without myelopathy or radiculopathy, lumbosacral region: Secondary | ICD-10-CM

## 2014-02-16 DIAGNOSIS — G8929 Other chronic pain: Secondary | ICD-10-CM | POA: Insufficient documentation

## 2014-02-16 MED ORDER — GABAPENTIN 100 MG PO CAPS: 100.0000 mg | ORAL_CAPSULE | Freq: Three times a day (TID) | ORAL | Status: DC

## 2014-02-16 MED ORDER — TRAMADOL HCL 50 MG PO TABS
50.0000 mg | ORAL_TABLET | Freq: Four times a day (QID) | ORAL | Status: DC | PRN
Start: 2014-02-16 — End: 2014-11-06

## 2014-02-16 NOTE — Progress Notes (Signed)
  PROCEDURE RECORD [facility]   Name: Mark Rice DOB:[birth year] MRN: [id number]  Date:02/16/2014  Physician: Claudette LawsAndrew Kirsteins, MD    Nurse/CMA: Walston CMA  Allergies:  Allergies  Allergen Reactions  . Penicillins Shortness Of Breath    Consent Signed: yes  Is patient diabetic? no  CBG today?   Pregnant: no LMP: No LMP for male patient. (age 35-55)  Anticoagulants: no Anti-inflammatory: no Antibiotics: no  Procedure: Left L2-3-4 Radiofrequency Neurotomy  Position: Prone Start Time: 10:05 End Time: 10:24 Fluoro Time: 39 seconds  RN/CMA Walston CMA Walston CMA    Time 950 1026    BP 155/79 163/82    Pulse 90 78    Respirations 14 14    O2 Sat 95 95    S/S 6 6    Pain Level 8/10 3/10     D/C home with wife, [name] & O X 3, D/C instructions reviewed, and sits independently.

## 2014-02-16 NOTE — Patient Instructions (Signed)

## 2014-02-16 NOTE — Progress Notes (Signed)
Left L2,3,4 medial branch radio frequency neurotomy under fluoroscopic guidance   Indication: Low back pain due to lumbar spondylosis which has been relieved on 2 occasions by greater than 50% by lumbar medial branch blocks at corresponding levels.>12 month relief after RF at same levels last performed in 06/07/2012  Informed consent was obtained after describing risks and benefits of the procedure with the patient, this includes bleeding, bruising, infection, paralysis and medication side effects. The patient wishes to proceed and has given written consent. The patient was placed in a prone position. The lumbar and sacral area was marked and prepped with Betadine. A 25-gauge 1-1/2 inch needle was inserted into the skin and subcutaneous tissue at 3 sites in one ML of 1% lidocaine was injected into each site. Then a 20-gauge 15cm cm radio frequency needle with a 1 cm curved active tip was inserted targeting the Right L3 SAP/transverse process jct. Bone contact was made and confirmed with lateral imaging. Sensory stimulation at 50 Hz followed by motor stimulation at 2 Hz confirm proper needle location followed by injection of one ML of the solution containing one ML of 4 mg per mL dexamethasone and 3 mL of 1% MPF lidocaine. Then the Right L5 SAP/transverse process junction was targeted. Bone contact was made and confirmed with lateral imaging. Sensory stimulation at 50 Hz followed by motor stimulation at 2 Hz confirm proper needle location followed by injection of one ML of the solution containing one ML of 4 mg per mL dexamethasone and 3 mL of 1% MPF lidocaine. Then the Right L4 SAP/transverse process junction was targeted. Bone contact was made and confirmed with lateral imaging. Sensory stimulation at 50 Hz followed by motor stimulation at 2 Hz confirm proper needle location followed by injection of one ML of the solution containing one ML of 4 mg per mL dexamethasone and 3 mL of 1% MPF lidocaine. Radio  frequency lesion being at [facility]80C for 90 seconds was performed. Needles were removed. Post procedure instructions and vital signs were performed. Patient tolerated procedure well. Followup appointment was given. And

## 2014-03-29 ENCOUNTER — Encounter: Payer: Self-pay | Admitting: Physical Medicine & Rehabilitation

## 2014-03-30 ENCOUNTER — Encounter: Payer: Self-pay | Admitting: Physical Medicine & Rehabilitation

## 2014-08-03 ENCOUNTER — Emergency Department (HOSPITAL_COMMUNITY): Payer: No Typology Code available for payment source

## 2014-08-03 ENCOUNTER — Encounter (HOSPITAL_COMMUNITY): Payer: Self-pay | Admitting: *Deleted

## 2014-08-03 ENCOUNTER — Emergency Department (HOSPITAL_COMMUNITY)
Admission: EM | Admit: 2014-08-03 | Discharge: 2014-08-03 | Disposition: A | Discharge status: Home / Self Care | Payer: No Typology Code available for payment source | Attending: Emergency Medicine | Admitting: Emergency Medicine

## 2014-08-03 DIAGNOSIS — F329 Major depressive disorder, single episode, unspecified: Secondary | ICD-10-CM | POA: Diagnosis not present

## 2014-08-03 DIAGNOSIS — Z79899 Other long term (current) drug therapy: Secondary | ICD-10-CM | POA: Insufficient documentation

## 2014-08-03 DIAGNOSIS — E119 Type 2 diabetes mellitus without complications: Secondary | ICD-10-CM | POA: Diagnosis not present

## 2014-08-03 DIAGNOSIS — Z880 Allergy status to penicillin: Secondary | ICD-10-CM | POA: Insufficient documentation

## 2014-08-03 DIAGNOSIS — Z87891 Personal history of nicotine dependence: Secondary | ICD-10-CM | POA: Insufficient documentation

## 2014-08-03 DIAGNOSIS — Z792 Long term (current) use of antibiotics: Secondary | ICD-10-CM | POA: Diagnosis not present

## 2014-08-03 DIAGNOSIS — S0081XA Abrasion of other part of head, initial encounter: Secondary | ICD-10-CM | POA: Insufficient documentation

## 2014-08-03 DIAGNOSIS — Y92410 Unspecified street and highway as the place of occurrence of the external cause: Secondary | ICD-10-CM | POA: Insufficient documentation

## 2014-08-03 DIAGNOSIS — S060X9A Concussion with loss of consciousness of unspecified duration, initial encounter: Secondary | ICD-10-CM

## 2014-08-03 DIAGNOSIS — S060X1A Concussion with loss of consciousness of 30 minutes or less, initial encounter: Secondary | ICD-10-CM | POA: Diagnosis not present

## 2014-08-03 DIAGNOSIS — S00212A Abrasion of left eyelid and periocular area, initial encounter: Secondary | ICD-10-CM | POA: Diagnosis not present

## 2014-08-03 DIAGNOSIS — Y998 Other external cause status: Secondary | ICD-10-CM | POA: Diagnosis not present

## 2014-08-03 DIAGNOSIS — Z8719 Personal history of other diseases of the digestive system: Secondary | ICD-10-CM | POA: Insufficient documentation

## 2014-08-03 DIAGNOSIS — S0990XA Unspecified injury of head, initial encounter: Secondary | ICD-10-CM | POA: Diagnosis present

## 2014-08-03 DIAGNOSIS — Y9389 Activity, other specified: Secondary | ICD-10-CM | POA: Insufficient documentation

## 2014-08-03 DIAGNOSIS — I10 Essential (primary) hypertension: Secondary | ICD-10-CM | POA: Diagnosis not present

## 2014-08-03 DIAGNOSIS — S199XXA Unspecified injury of neck, initial encounter: Secondary | ICD-10-CM | POA: Insufficient documentation

## 2014-08-03 DIAGNOSIS — I509 Heart failure, unspecified: Secondary | ICD-10-CM | POA: Diagnosis not present

## 2014-08-03 DIAGNOSIS — S069X1A Unspecified intracranial injury with loss of consciousness of 30 minutes or less, initial encounter: Secondary | ICD-10-CM

## 2014-08-03 LAB — CBC WITH DIFFERENTIAL/PLATELET
BASOS PCT: 0 % (ref 0–1)
Basophils Absolute: 0 10*3/uL (ref 0.0–0.1)
EOS ABS: 0.2 10*3/uL (ref 0.0–0.7)
Eosinophils Relative: 2 % (ref 0–5)
HEMATOCRIT: 45 % (ref 39.0–52.0)
Hemoglobin: 15 g/dL (ref 13.0–17.0)
Lymphocytes Relative: 24 % (ref 12–46)
Lymphs Abs: 2.6 10*3/uL (ref 0.7–4.0)
MCH: 28.5 pg (ref 26.0–34.0)
MCHC: 33.3 g/dL (ref 30.0–36.0)
MCV: 85.4 fL (ref 78.0–100.0)
MONO ABS: 0.9 10*3/uL (ref 0.1–1.0)
MONOS PCT: 8 % (ref 3–12)
NEUTROS PCT: 66 % (ref 43–77)
Neutro Abs: 7.1 10*3/uL (ref 1.7–7.7)
Platelets: 259 10*3/uL (ref 150–400)
RBC: 5.27 MIL/uL (ref 4.22–5.81)
RDW: 13.5 % (ref 11.5–15.5)
WBC: 10.7 10*3/uL — ABNORMAL HIGH (ref 4.0–10.5)

## 2014-08-03 LAB — BASIC METABOLIC PANEL
Anion gap: 13 (ref 5–15)
BUN: 11 mg/dL (ref 6–23)
CO2: 25 mEq/L (ref 19–32)
Calcium: 9.8 mg/dL (ref 8.4–10.5)
Chloride: 98 mEq/L (ref 96–112)
Creatinine, Ser: 1.04 mg/dL (ref 0.50–1.35)
Glucose, Bld: 89 mg/dL (ref 70–99)
Potassium: 3.9 mEq/L (ref 3.7–5.3)
Sodium: 136 mEq/L — ABNORMAL LOW (ref 137–147)

## 2014-08-03 MED ORDER — OXYCODONE-ACETAMINOPHEN 5-325 MG PO TABS: 1.0000 | ORAL_TABLET | Freq: Four times a day (QID) | ORAL | Status: DC | PRN

## 2014-08-03 MED ORDER — ACETAMINOPHEN 325 MG PO TABS
650.0000 mg | ORAL_TABLET | Freq: Once | ORAL | Status: AC
  Administered 2014-08-03: 650 mg via ORAL
  Filled 2014-08-03: qty 2

## 2014-08-03 MED ORDER — FENTANYL CITRATE 0.05 MG/ML IJ SOLN
50.0000 ug | Freq: Once | INTRAMUSCULAR | Status: AC
  Administered 2014-08-03: 50 ug via INTRAVENOUS
  Filled 2014-08-03: qty 2

## 2014-08-03 MED ORDER — IOHEXOL 300 MG/ML  SOLN
100.0000 mL | Freq: Once | INTRAMUSCULAR | Status: AC | PRN
  Administered 2014-08-03: 100 mL via INTRAVENOUS

## 2014-08-03 MED ORDER — SODIUM CHLORIDE 0.9 % IV SOLN
INTRAVENOUS | Status: DC
  Administered 2014-08-03: 15:00:00 via INTRAVENOUS
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
## 2014-08-03 NOTE — Discharge Instructions (Signed)
Concussion  A concussion, or closed-head injury, is a brain injury caused by a direct blow to the head or by a quick and sudden movement (jolt) of the head or neck. Concussions are usually not life-threatening. Even so, the effects of a concussion can be serious. If you have had a concussion before, you are more likely to experience concussion-like symptoms after a direct blow to the head.   CAUSES  · Direct blow to the head, such as from running into another player during a soccer game, being hit in a fight, or hitting your head on a hard surface.  · A jolt of the head or neck that causes the brain to move back and forth inside the skull, such as in a car crash.  SIGNS AND SYMPTOMS  The signs of a concussion can be hard to notice. Early on, they may be missed by you, family members, and health care providers. You may look fine but act or feel differently.  Symptoms are usually temporary, but they may last for days, weeks, or even longer. Some symptoms may appear right away while others may not show up for hours or days. Every head injury is different. Symptoms include:  · Mild to moderate headaches that will not go away.  · A feeling of pressure inside your head.  · Having more trouble than usual:  ¨ Learning or remembering things you have heard.  ¨ Answering questions.  ¨ Paying attention or concentrating.  ¨ Organizing daily tasks.  ¨ Making decisions and solving problems.  · Slowness in thinking, acting or reacting, speaking, or reading.  · Getting lost or being easily confused.  · Feeling tired all the time or lacking energy (fatigued).  · Feeling drowsy.  · Sleep disturbances.  ¨ Sleeping more than usual.  ¨ Sleeping less than usual.  ¨ Trouble falling asleep.  ¨ Trouble sleeping (insomnia).  · Loss of balance or feeling lightheaded or dizzy.  · Nausea or vomiting.  · Numbness or tingling.  · Increased sensitivity to:  ¨ Sounds.  ¨ Lights.  ¨ Distractions.  · Vision problems or eyes that tire  easily.  · Diminished sense of taste or smell.  · Ringing in the ears.  · Mood changes such as feeling sad or anxious.  · Becoming easily irritated or angry for little or no reason.  · Lack of motivation.  · Seeing or hearing things other people do not see or hear (hallucinations).  DIAGNOSIS  Your health care provider can usually diagnose a concussion based on a description of your injury and symptoms. He or she will ask whether you passed out (lost consciousness) and whether you are having trouble remembering events that happened right before and during your injury.  Your evaluation might include:  · A brain scan to look for signs of injury to the brain. Even if the test shows no injury, you may still have a concussion.  · Blood tests to be sure other problems are not present.  TREATMENT  · Concussions are usually treated in an emergency department, in urgent care, or at a clinic. You may need to stay in the hospital overnight for further treatment.  · Tell your health care provider if you are taking any medicines, including prescription medicines, over-the-counter medicines, and natural remedies. Some medicines, such as blood thinners (anticoagulants) and aspirin, may increase the chance of complications. Also tell your health care provider whether you have had alcohol or are taking illegal drugs. This information   may affect treatment.  · Your health care provider will send you home with important instructions to follow.  · How fast you will recover from a concussion depends on many factors. These factors include how severe your concussion is, what part of your brain was injured, your age, and how healthy you were before the concussion.  · Most people with mild injuries recover fully. Recovery can take time. In general, recovery is slower in older persons. Also, persons who have had a concussion in the past or have other medical problems may find that it takes longer to recover from their current injury.  HOME  CARE INSTRUCTIONS  General Instructions  · Carefully follow the directions your health care provider gave you.  · Only take over-the-counter or prescription medicines for pain, discomfort, or fever as directed by your health care provider.  · Take only those medicines that your health care provider has approved.  · Do not drink alcohol until your health care provider says you are well enough to do so. Alcohol and certain other drugs may slow your recovery and can put you at risk of further injury.  · If it is harder than usual to remember things, write them down.  · If you are easily distracted, try to do one thing at a time. For example, do not try to watch TV while fixing dinner.  · Talk with family members or close friends when making important decisions.  · Keep all follow-up appointments. Repeated evaluation of your symptoms is recommended for your recovery.  · Watch your symptoms and tell others to do the same. Complications sometimes occur after a concussion. Older adults with a brain injury may have a higher risk of serious complications, such as a blood clot on the brain.  · Tell your teachers, school nurse, school counselor, coach, athletic trainer, or work manager about your injury, symptoms, and restrictions. Tell them about what you can or cannot do. They should watch for:  ¨ Increased problems with attention or concentration.  ¨ Increased difficulty remembering or learning new information.  ¨ Increased time needed to complete tasks or assignments.  ¨ Increased irritability or decreased ability to cope with stress.  ¨ Increased symptoms.  · Rest. Rest helps the brain to heal. Make sure you:  ¨ Get plenty of sleep at night. Avoid staying up late at night.  ¨ Keep the same bedtime hours on weekends and weekdays.  ¨ Rest during the day. Take daytime naps or rest breaks when you feel tired.  · Limit activities that require a lot of thought or concentration. These include:  ¨ Doing homework or job-related  work.  ¨ Watching TV.  ¨ Working on the computer.  · Avoid any situation where there is potential for another head injury (football, hockey, soccer, basketball, martial arts, downhill snow sports and horseback riding). Your condition will get worse every time you experience a concussion. You should avoid these activities until you are evaluated by the appropriate follow-up health care providers.  Returning To Your Regular Activities  You will need to return to your normal activities slowly, not all at once. You must give your body and brain enough time for recovery.  · Do not return to sports or other athletic activities until your health care provider tells you it is safe to do so.  · Ask your health care provider when you can drive, ride a bicycle, or operate heavy machinery. Your ability to react may be slower after a   brain injury. Never do these activities if you are dizzy.  · Ask your health care provider about when you can return to work or school.  Preventing Another Concussion  It is very important to avoid another brain injury, especially before you have recovered. In rare cases, another injury can lead to permanent brain damage, brain swelling, or death. The risk of this is greatest during the first 7-10 days after a head injury. Avoid injuries by:  · Wearing a seat belt when riding in a car.  · Drinking alcohol only in moderation.  · Wearing a helmet when biking, skiing, skateboarding, skating, or doing similar activities.  · Avoiding activities that could lead to a second concussion, such as contact or recreational sports, until your health care provider says it is okay.  · Taking safety measures in your home.  ¨ Remove clutter and tripping hazards from floors and stairways.  ¨ Use grab bars in bathrooms and handrails by stairs.  ¨ Place non-slip mats on floors and in bathtubs.  ¨ Improve lighting in dim areas.  SEEK MEDICAL CARE IF:  · You have increased problems paying attention or  concentrating.  · You have increased difficulty remembering or learning new information.  · You need more time to complete tasks or assignments than before.  · You have increased irritability or decreased ability to cope with stress.  · You have more symptoms than before.  Seek medical care if you have any of the following symptoms for more than 2 weeks after your injury:  · Lasting (chronic) headaches.  · Dizziness or balance problems.  · Nausea.  · Vision problems.  · Increased sensitivity to noise or light.  · Depression or mood swings.  · Anxiety or irritability.  · Memory problems.  · Difficulty concentrating or paying attention.  · Sleep problems.  · Feeling tired all the time.  SEEK IMMEDIATE MEDICAL CARE IF:  · You have severe or worsening headaches. These may be a sign of a blood clot in the brain.  · You have weakness (even if only in one hand, leg, or part of the face).  · You have numbness.  · You have decreased coordination.  · You vomit repeatedly.  · You have increased sleepiness.  · One pupil is larger than the other.  · You have convulsions.  · You have slurred speech.  · You have increased confusion. This may be a sign of a blood clot in the brain.  · You have increased restlessness, agitation, or irritability.  · You are unable to recognize people or places.  · You have neck pain.  · It is difficult to wake you up.  · You have unusual behavior changes.  · You lose consciousness.  MAKE SURE YOU:  · Understand these instructions.  · Will watch your condition.  · Will get help right away if you are not doing well or get worse.  Document Released: 11/01/2003 Document Revised: 08/16/2013 Document Reviewed: 03/03/2013  [employer]® Patient Information ©2015 [employer]. This information is not intended to replace advice given to you by your health care provider. Make sure you discuss any questions you have with your health care provider.
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
## 2014-08-03 NOTE — ED Notes (Addendum)
Pt was able to communicate that he has pain in his head top and front 10/10.

## 2014-08-03 NOTE — ED Notes (Signed)
Off floor with CT 
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
## 2014-08-03 NOTE — ED Notes (Signed)
Pt informed that he can take c-collar off, offered to do for him and pt refused. Educated pt on discharge instructions and follow-up.

## 2014-08-16 ENCOUNTER — Encounter: Payer: BC Managed Care – PPO | Admitting: Diagnostic Neuroimaging

## 2014-08-17 NOTE — Progress Notes (Signed)
This encounter was created in error - please disregard.

## 2014-08-21 ENCOUNTER — Ambulatory Visit: Payer: BC Managed Care – PPO | Admitting: Physical Medicine & Rehabilitation

## 2014-08-22 ENCOUNTER — Encounter: Payer: Self-pay | Admitting: Diagnostic Neuroimaging

## 2014-11-06 ENCOUNTER — Ambulatory Visit (INDEPENDENT_AMBULATORY_CARE_PROVIDER_SITE_OTHER): Payer: Self-pay | Admitting: Neurology

## 2014-11-06 ENCOUNTER — Encounter: Payer: Self-pay | Admitting: Neurology

## 2014-11-06 ENCOUNTER — Encounter: Payer: Self-pay | Admitting: *Deleted

## 2014-11-06 DIAGNOSIS — Z0289 Encounter for other administrative examinations: Secondary | ICD-10-CM

## 2014-11-06 DIAGNOSIS — R410 Disorientation, unspecified: Secondary | ICD-10-CM

## 2014-11-06 MED ORDER — LEVETIRACETAM 1000 MG PO TABS: 1000.0000 mg | ORAL_TABLET | Freq: Two times a day (BID) | ORAL | Status: DC

## 2014-11-06 MED ORDER — LEVETIRACETAM 1000 MG PO TABS: ORAL_TABLET | ORAL | Status: DC

## 2014-11-06 MED ORDER — TRAMADOL HCL 50 MG PO TABS: 50.0000 mg | ORAL_TABLET | Freq: Four times a day (QID) | ORAL | Status: DC | PRN

## 2014-11-06 MED ORDER — GABAPENTIN 100 MG PO CAPS: 100.0000 mg | ORAL_CAPSULE | Freq: Three times a day (TID) | ORAL | Status: DC
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
## 2014-11-07 ENCOUNTER — Other Ambulatory Visit (INDEPENDENT_AMBULATORY_CARE_PROVIDER_SITE_OTHER): Payer: Self-pay | Admitting: Neurology

## 2014-11-07 DIAGNOSIS — R410 Disorientation, unspecified: Secondary | ICD-10-CM

## 2014-11-10 DIAGNOSIS — R410 Disorientation, unspecified: Secondary | ICD-10-CM

## 2014-11-14 ENCOUNTER — Other Ambulatory Visit: Payer: Self-pay | Admitting: Neurology

## 2014-11-14 DIAGNOSIS — R410 Disorientation, unspecified: Secondary | ICD-10-CM
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
## 2014-11-16 ENCOUNTER — Encounter: Payer: Self-pay | Admitting: *Deleted

## 2014-11-16 ENCOUNTER — Encounter: Payer: Self-pay | Admitting: Neurology

## 2014-11-16 ENCOUNTER — Ambulatory Visit (INDEPENDENT_AMBULATORY_CARE_PROVIDER_SITE_OTHER): Payer: Self-pay | Admitting: Neurology

## 2014-11-16 DIAGNOSIS — R410 Disorientation, unspecified: Secondary | ICD-10-CM

## 2014-11-16 MED ORDER — TRAMADOL HCL 50 MG PO TABS: 50.0000 mg | ORAL_TABLET | Freq: Two times a day (BID) | ORAL | Status: DC | PRN
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
## 2014-11-21 ENCOUNTER — Telehealth: Payer: Self-pay | Admitting: Neurology

## 2014-11-21 NOTE — Telephone Encounter (Signed)
New order form completed and will be faxed 11/27/14 when Dr. Terrace ArabiaYan is back in the office to sign orders.

## 2014-11-21 NOTE — Telephone Encounter (Signed)
Mark JonesCarolyn is calling asking if we use [employer] order forms because they don't use [facility] forms anymore.  Please call her number is [phone] Ext. [phone].

## 2014-11-21 NOTE — Telephone Encounter (Signed)
Spoke to [place] - she is going to fax me a new referral form.

## 2014-12-04 ENCOUNTER — Encounter: Payer: Self-pay | Admitting: Physical Medicine & Rehabilitation

## 2014-12-04 ENCOUNTER — Telehealth: Payer: Self-pay | Admitting: *Deleted

## 2014-12-04 ENCOUNTER — Ambulatory Visit (HOSPITAL_BASED_OUTPATIENT_CLINIC_OR_DEPARTMENT_OTHER): Payer: 59 | Admitting: Physical Medicine & Rehabilitation

## 2014-12-04 ENCOUNTER — Encounter: Payer: 59 | Attending: Physical Medicine & Rehabilitation

## 2014-12-04 VITALS — BP 155/85 | HR 95 | Resp 14

## 2014-12-04 DIAGNOSIS — M47817 Spondylosis without myelopathy or radiculopathy, lumbosacral region: Secondary | ICD-10-CM | POA: Insufficient documentation

## 2014-12-04 DIAGNOSIS — R410 Disorientation, unspecified: Secondary | ICD-10-CM

## 2014-12-04 NOTE — Patient Instructions (Signed)
You will be placed on the cancellation list so that if there is a cancellation can get you in sooner

## 2014-12-04 NOTE — Telephone Encounter (Signed)
Ambulatory referral to the neuropsychiatric evaluation is complete,  Please refer him to the preferred provider,  [facility] [phone] Chalmers GuestAndrew Goss [phone] [place] [phone]
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
## 2014-12-04 NOTE — Telephone Encounter (Signed)
Duplicate - request sent to referral team.

## 2014-12-26 ENCOUNTER — Encounter: Payer: Self-pay | Admitting: *Deleted

## 2014-12-26 ENCOUNTER — Telehealth: Payer: Self-pay | Admitting: Neurology

## 2014-12-26 NOTE — Telephone Encounter (Signed)
Patient's insurance required him to see PCP prior to covering the cost of his neuropsychological and ambulatory EEG.  Ok per Dr. Terrace ArabiaYan to extend work note.

## 2014-12-26 NOTE — Telephone Encounter (Signed)
Completed and faxed to requested number.  Pt and wife aware.

## 2014-12-26 NOTE — Telephone Encounter (Signed)
Thessa, pt's wife called to r/s his follow up appointment on 01/02/15 to 01/25/15 and would like to know if DR. Terrace ArabiaYan could write a letter for him being out of work. Please call and advice # [phone] fax # [phone]

## 2014-12-28 ENCOUNTER — Ambulatory Visit: Payer: Self-pay | Admitting: Neurology

## 2015-01-02 ENCOUNTER — Encounter: Payer: Self-pay | Admitting: Physical Medicine & Rehabilitation

## 2015-01-02 ENCOUNTER — Other Ambulatory Visit: Payer: Self-pay | Admitting: *Deleted

## 2015-01-02 ENCOUNTER — Ambulatory Visit (HOSPITAL_BASED_OUTPATIENT_CLINIC_OR_DEPARTMENT_OTHER): Payer: 59 | Admitting: Physical Medicine & Rehabilitation

## 2015-01-02 ENCOUNTER — Encounter: Payer: 59 | Attending: Physical Medicine & Rehabilitation

## 2015-01-02 ENCOUNTER — Ambulatory Visit: Payer: 59 | Admitting: Physical Medicine & Rehabilitation

## 2015-01-02 VITALS — BP 147/73 | HR 91 | Resp 14

## 2015-01-02 DIAGNOSIS — M47817 Spondylosis without myelopathy or radiculopathy, lumbosacral region: Secondary | ICD-10-CM | POA: Insufficient documentation

## 2015-01-02 MED ORDER — DIAZEPAM 10 MG PO TABS: 10.0000 mg | ORAL_TABLET | Freq: Once | ORAL | Status: DC

## 2015-01-02 NOTE — Progress Notes (Signed)
Right lumbar L2, L3, L4 medial branch blocks injection under fluoroscopic guidance  Indication: Right Lumbar pain which is not relieved by medication management or other conservative care and interfering with self-care and mobility.  Informed consent was obtained after describing risks and benefits of the procedure with the patient, this includes bleeding, bruising, infection, paralysis and medication side effects. The patient wishes to proceed and has given written consent. The patient was placed in a prone position. The lumbar area was marked and prepped with Betadine. One ML of 1% lidocaine was injected into each of 3 areas into the skin and subcutaneous tissue. Then a 22-gauge 5inch spinal needle was inserted targeting the junction of the Right S1 superior articular process and sacral ala junction. Needle was advanced under fluoroscopic guidance. Bone contact was made. Omnipaque 180 was injected x0.5 mL demonstrating no intravascular uptake. Then a solution containing one ML of 4 mg per mL dexamethasone and 3 mL of 2% MPF lidocaine was injected x0.5 mL. Then the Right L5 superior articular process in transverse process junction was targeted. Bone contact was made. Omnipaque 180 was injected x0.5 mL demonstrating no intravascular uptake. Then a solution containing one ML of 4 mg per mL dexamethasone and 3 mL of 2% MPF lidocaine was injected x0.5 mL. Then the Right L4 superior articular process in transverse process junction was targeted. Bone contact was made. Omnipaque 180 was injected x0.5 mL demonstrating no intravascular uptake. Then a solution containing one ML of 4 mg per mL dexamethasone and 3 mL of 2% MPF lidocaine was injected x0.5 mL Patient tolerated procedure well. Post procedure instructions were given. Please refer to post procedure form.

## 2015-01-02 NOTE — Patient Instructions (Signed)

## 2015-01-02 NOTE — Progress Notes (Signed)
  PROCEDURE RECORD [facility]   Name: Kalman JewelsDerric Mcmurphy DOB:07-23-1981 MRN: [id number]  Date:01/02/2015  Physician: Claudette LawsAndrew Kirsteins, MD    Nurse/CMA: Suezanne JacquetSybil Shumaker  Allergies:  Allergies  Allergen Reactions  . Penicillins Shortness Of Breath    Consent Signed: Yes.    Is patient diabetic? No.  CBG today?   Pregnant: No. LMP: No LMP for male patient. (age 36-55)  Anticoagulants: no Anti-inflammatory: no Antibiotics: no  Procedure: Right lumbar medial branch block  Position: Prone Start Time: 3:15 End Time:3:19  Fluoro Time: 26 sec  RN/CMA Purvis SheffieldKen Sallie Maker Sybil Shumaker    Time 2:50 PM 3:24    BP 147/73 131/51    Pulse 91 92    Respirations 14 14    O2 Sat 97 98    S/S 6 6    Pain Level 8/10 6/10     D/C home with wife, [name] & O X 3, D/C instructions reviewed, and sits independently.

## 2015-01-25 ENCOUNTER — Ambulatory Visit: Payer: Self-pay | Admitting: Neurology

## 2015-01-26 ENCOUNTER — Ambulatory Visit: Payer: 59 | Admitting: Physical Medicine & Rehabilitation

## 2015-01-26 ENCOUNTER — Telehealth: Payer: Self-pay | Admitting: *Deleted

## 2015-01-26 ENCOUNTER — Encounter: Payer: 59 | Attending: Physical Medicine & Rehabilitation

## 2015-01-26 ENCOUNTER — Encounter: Payer: Self-pay | Admitting: Physical Medicine & Rehabilitation

## 2015-01-26 ENCOUNTER — Ambulatory Visit (HOSPITAL_BASED_OUTPATIENT_CLINIC_OR_DEPARTMENT_OTHER): Payer: 59 | Admitting: Physical Medicine & Rehabilitation

## 2015-01-26 VITALS — BP 131/67 | HR 105 | Resp 16

## 2015-01-26 DIAGNOSIS — M47817 Spondylosis without myelopathy or radiculopathy, lumbosacral region: Secondary | ICD-10-CM

## 2015-01-26 MED ORDER — DIAZEPAM 10 MG PO TABS: 10.0000 mg | ORAL_TABLET | Freq: Once | ORAL | Status: DC

## 2015-01-26 NOTE — Progress Notes (Signed)
RightL2., Right L4 and Right L3 medial branch radio frequency neurotomy under fluoroscopic guidance   Indication: Low back pain due to lumbar spondylosis which has been relieved on 2 occasions by greater than 50% by lumbar medial branch blocks at corresponding levels.6 month relief after RF at same levels last performed in July 2014  Informed consent was obtained after describing risks and benefits of the procedure with the patient, this includes bleeding, bruising, infection, paralysis and medication side effects. The patient wishes to proceed and has given written consent. The patient was placed in a prone position. The lumbar and sacral area was marked and prepped with Betadine. A 25-gauge 1-1/2 inch needle was inserted into the skin and subcutaneous tissue at 3 sites in one ML of 1% lidocaine was injected into each site. Then a 20-gauge 15cm cm radio frequency needle with a 1 cm curved active tip was inserted targeting the Right L3 SAP/transverse process jct. Bone contact was made and confirmed with lateral imaging. Sensory stimulation at 50 Hz followed by motor stimulation at 2 Hz confirm proper needle location followed by injection of one ML of the solution containing one ML of 4 mg per mL dexamethasone and 3 mL of 1% MPF lidocaine. Then the Right L5 SAP/transverse process junction was targeted. Bone contact was made and confirmed with lateral imaging. Sensory stimulation at 50 Hz followed by motor stimulation at 2 Hz confirm proper needle location followed by injection of one ML of the solution containing one ML of 4 mg per mL dexamethasone and 3 mL of 1% MPF lidocaine. Then the Right L4 SAP/transverse process junction was targeted. Bone contact was made and confirmed with lateral imaging. Sensory stimulation at 50 Hz followed by motor stimulation at 2 Hz confirm proper needle location followed by injection of one ML of the solution containing one ML of 4 mg per mL dexamethasone and 3 mL of 1% MPF  lidocaine. Radio frequency lesion being at 80C for 90 seconds was performed. Needles were removed. Post procedure instructions and vital signs were performed. Patient tolerated procedure well. Followup appointment was given. And 

## 2015-01-26 NOTE — Patient Instructions (Signed)
Take an additional 900mg  of neurontin (gabapentin) today only

## 2015-01-26 NOTE — Progress Notes (Addendum)
  PROCEDURE RECORD [facility]   Name: Mark Rice DOB:05/30/1981 MRN: [id number]  Date:01/26/2015  Physician: Claudette LawsAndrew Kirsteins, MD    Nurse/CMA:Shumaker RN  Allergies:  Allergies  Allergen Reactions  . Penicillins Anaphylaxis    Shortness of breath    Consent Signed: Yes.    Is patient diabetic? No.  CBG today?  Pregnant: No. LMP: No LMP for male patient. (age 36-55)  Anticoagulants: no Anti-inflammatory: no Antibiotics: no  Procedure: right lumbar 2-3-4 radiofrequency neurotomy Position: Prone Start Time:  End Time:  Fluoro Time:   RN/CMA Designer, multimediahumaker RN Shumaker RN    Time 1:58 3:11    BP 131/67 154/92    Pulse 105 93    Respirations 16 16    O2 Sat 94 97    S/S 6 6    Pain Level 10/10 0     D/C home with wife, [name] & O X 3, D/C instructions reviewed, and sits independently.       3:

## 2015-01-26 NOTE — Telephone Encounter (Signed)
Requesting valium for procedure.  Called to pharmacy. Left VM on name idnetified VM alerting him that we had called in medication for his procedure.

## 2015-01-29 ENCOUNTER — Telehealth: Payer: Self-pay | Admitting: *Deleted

## 2015-01-29 NOTE — Telephone Encounter (Signed)
FYI -  Spoke to patient's wife - he has changed insurance plans and [facility]  He was never able to complete the ambulatory EEG because the company was also out-of-network.

## 2015-02-06 ENCOUNTER — Ambulatory Visit (HOSPITAL_BASED_OUTPATIENT_CLINIC_OR_DEPARTMENT_OTHER): Payer: 59 | Admitting: Physical Medicine & Rehabilitation

## 2015-02-06 ENCOUNTER — Encounter: Payer: Self-pay | Admitting: Physical Medicine & Rehabilitation

## 2015-02-06 VITALS — BP 150/91 | HR 103 | Resp 16

## 2015-02-06 DIAGNOSIS — M5412 Radiculopathy, cervical region: Secondary | ICD-10-CM

## 2015-02-06 DIAGNOSIS — S161XXD Strain of muscle, fascia and tendon at neck level, subsequent encounter: Secondary | ICD-10-CM

## 2015-02-06 DIAGNOSIS — S069X1D Unspecified intracranial injury with loss of consciousness of 30 minutes or less, subsequent encounter: Secondary | ICD-10-CM

## 2015-02-06 DIAGNOSIS — M47817 Spondylosis without myelopathy or radiculopathy, lumbosacral region: Secondary | ICD-10-CM | POA: Diagnosis not present

## 2015-02-06 MED ORDER — GABAPENTIN 300 MG PO CAPS
300.0000 mg | ORAL_CAPSULE | Freq: Two times a day (BID) | ORAL | Status: DC
Start: 2015-02-06 — End: 2015-02-21

## 2015-02-06 NOTE — Patient Instructions (Signed)
Will start physical therapy occupational therapy speech therapy for both traumatic brain injury as well as neck pain radiating to the right arm We'll get MRI of the cervical spine to evaluate for right arm numbness specifically looking for a pinched nerve in the neck. There is no evidence of fracture on the cervical spine CT scan sign not concerned about that.  See Dr. Cephus Richer in regards to the traumatic brain injury as well as the neurologist at [facility] to evaluate for seizure see if the Keppra dose may be reduced or eventually weaned off.
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
## 2015-02-13 ENCOUNTER — Ambulatory Visit: Payer: 59 | Admitting: Physical Medicine & Rehabilitation

## 2015-02-20 ENCOUNTER — Telehealth: Payer: Self-pay | Admitting: *Deleted

## 2015-02-20 ENCOUNTER — Telehealth: Payer: Self-pay | Admitting: Physical Medicine & Rehabilitation

## 2015-02-20 MED ORDER — PREGABALIN 75 MG PO CAPS: 75.0000 mg | ORAL_CAPSULE | Freq: Two times a day (BID) | ORAL | Status: DC

## 2015-02-20 MED ORDER — ACETAMINOPHEN-CODEINE #3 300-30 MG PO TABS: 1.0000 | ORAL_TABLET | Freq: Three times a day (TID) | ORAL | Status: DC | PRN

## 2015-02-20 NOTE — Telephone Encounter (Signed)
Spoke with wife. She reports taking the patient to the emergency department however I do not see any documentation of this on care everywhere. She states it was yesterday and the patient received Dilaudid for pain but this was not helpful. She notes concerns about elevated blood pressure which she attributes to pain. She also stated that his right upper ext weakness was evaluated in the ER yesterday. I asked her if she had scheduled MRI of the cervical spine for her husband. She states she had not heard anything about that. An order was placed at the last clinic visit.  We discussed that since the Dilaudid was not helpful for his pain, it could be that his pain is more neurogenic and requires a different type of medicine. She wanted to try anything that may be potentially helpful. Recommend trial of Lyrica 75 mg twice a day Would stop gabapentin during this time May try Tylenol 3 one by mouth 3 times a day when necessary Patient will follow up with Dr. Cephus RicherAguilar from physical medicine rehabilitation in regards to his head injury I've attempted to call Dr. Cephus RicherAguilar to see if there is another physical medicine rehabilitation for pain management specialist that can [facility] the [facility] So that coordination between various providers can be Maximized.Patient is no longer seeing neurolog[place] [facility].  Have left a message at Dr. Garnetta BuddyAguilar's office, wife is in agreement with this plan

## 2015-02-20 NOTE — Telephone Encounter (Signed)
Mark Rice is in extreme pain and needs something. Gabapentin is not helping. He has been to ED [facility] .  Dr Wynn BankerKirsteins talked with her and verbal order given for Tylenol # 3 1 q 8 hr prn #90 0 RF and Lyrica 75 mg bid #60 1 RF . Stop Gabapentin. [facility] [address]

## 2015-02-21 ENCOUNTER — Encounter: Payer: Self-pay | Admitting: *Deleted

## 2015-02-21 ENCOUNTER — Telehealth: Payer: Self-pay | Admitting: *Deleted

## 2015-02-21 NOTE — Telephone Encounter (Signed)
Prior auth initiated with [employer] for Lyrica 75 mg BID

## 2015-02-21 NOTE — Telephone Encounter (Signed)
Pt's wife called, very thankful for the help we gave them 02/20/2015. Reports that pt is doing well with tylenol #3. However, they were not able to p/u lyrica as it needs a PA.  She also asked about referrals for PT, OT, and MRI.  I called the pt back and spoke to his wife. Informed her that prior authorization will be sent for Lyrica and gave the pt telephone numbers for outpatient rehab and [place] imaging. I advised her to call them direct to confirm status of referrals

## 2015-02-27 ENCOUNTER — Inpatient Hospital Stay: Admission: RE | Admit: 2015-02-27 | Payer: 59 | Source: Ambulatory Visit

## 2015-03-02 ENCOUNTER — Ambulatory Visit
Admission: RE | Admit: 2015-03-02 | Discharge: 2015-03-02 | Disposition: A | Discharge status: Home / Self Care | Payer: 59 | Source: Ambulatory Visit | Attending: Physical Medicine & Rehabilitation | Admitting: Physical Medicine & Rehabilitation

## 2015-03-02 DIAGNOSIS — M5412 Radiculopathy, cervical region: Secondary | ICD-10-CM

## 2015-03-06 ENCOUNTER — Ambulatory Visit: Payer: 59 | Admitting: Physical Medicine & Rehabilitation

## 2015-03-06 ENCOUNTER — Telehealth: Payer: Self-pay | Admitting: *Deleted

## 2015-03-06 ENCOUNTER — Other Ambulatory Visit: Payer: Self-pay | Admitting: *Deleted

## 2015-03-06 NOTE — Telephone Encounter (Signed)
Prior Auth approved from 02/22/2015 thru 02/20/2017 #[id number]

## 2015-03-09 ENCOUNTER — Encounter: Payer: 59 | Attending: Physical Medicine & Rehabilitation

## 2015-03-09 ENCOUNTER — Encounter: Payer: Self-pay | Admitting: Physical Medicine & Rehabilitation

## 2015-03-09 ENCOUNTER — Ambulatory Visit (HOSPITAL_BASED_OUTPATIENT_CLINIC_OR_DEPARTMENT_OTHER): Payer: 59 | Admitting: Physical Medicine & Rehabilitation

## 2015-03-09 VITALS — BP 149/79 | HR 91 | Resp 16

## 2015-03-09 DIAGNOSIS — M47817 Spondylosis without myelopathy or radiculopathy, lumbosacral region: Secondary | ICD-10-CM | POA: Diagnosis present

## 2015-03-09 DIAGNOSIS — Z5181 Encounter for therapeutic drug level monitoring: Secondary | ICD-10-CM

## 2015-03-09 DIAGNOSIS — Z79899 Other long term (current) drug therapy: Secondary | ICD-10-CM

## 2015-03-09 DIAGNOSIS — M542 Cervicalgia: Secondary | ICD-10-CM

## 2015-03-09 MED ORDER — ACETAMINOPHEN-CODEINE #3 300-30 MG PO TABS: 1.0000 | ORAL_TABLET | Freq: Three times a day (TID) | ORAL | Status: DC | PRN

## 2015-03-09 NOTE — Progress Notes (Signed)
Left L2,3,4 medial branch radio frequency neurotomy under fluoroscopic guidance   Indication: Low back pain due to lumbar spondylosis which has been relieved on 2 occasions by greater than 50% by lumbar medial branch blocks at corresponding levels.11 month relief after RF at same levels last performed in 02/16/2014  Informed consent was obtained after describing risks and benefits of the procedure with the patient, this includes bleeding, bruising, infection, paralysis and medication side effects. The patient wishes to proceed and has given written consent. The patient was placed in a prone position. The lumbar and sacral area was marked and prepped with Betadine. A 25-gauge 1-1/2 inch needle was inserted into the skin and subcutaneous tissue at 3 sites in one ML of 1% lidocaine was injected into each site. Then a 20-gauge 15cm cm radio frequency needle with a 1 cm curved active tip was inserted targeting the Right L3 SAP/transverse process jct. Bone contact was made and confirmed with lateral imaging. Sensory stimulation at 50 Hz followed by motor stimulation at 2 Hz confirm proper needle location followed by injection of one ML of the solution containing one ML of 4 mg per mL dexamethasone and 3 mL of 1% MPF lidocaine. Then the Right L5 SAP/transverse process junction was targeted. Bone contact was made and confirmed with lateral imaging. Sensory stimulation at 50 Hz followed by motor stimulation at 2 Hz confirm proper needle location followed by injection of one ML of the solution containing one ML of 4 mg per mL dexamethasone and 3 mL of 1% MPF lidocaine. Then the Right L4 SAP/transverse process junction was targeted. Bone contact was made and confirmed with lateral imaging. Sensory stimulation at 50 Hz followed by motor stimulation at 2 Hz confirm proper needle location followed by injection of one ML of the solution containing one ML of 4 mg per mL dexamethasone and 3 mL of 1% MPF lidocaine. Radio  frequency lesion being at [facility] for 90 seconds was performed. Needles were removed. Post procedure instructions and vital signs were performed. Patient tolerated procedure well. Followup appointment was given.

## 2015-03-09 NOTE — Progress Notes (Signed)
  PROCEDURE RECORD [place] [facility]   Name: Mark Rice Surgeon DOB:1980/12/26 MRN: [id number]  Date:03/09/2015  Physician: Claudette LawsAndrew Kirsteins, MD    Nurse/CMA: Mark Yu RN  Allergies:  Allergies  Allergen Reactions  . Penicillins Anaphylaxis    Shortness of breath    Consent Signed: Yes.    Is patient diabetic? No.  CBG today?   Pregnant: No. LMP: No LMP for male patient. (age 36-55)  Anticoagulants: no Anti-inflammatory: no Antibiotics: no  Procedure: left Radiofrequency neurotomy Position: Prone Start Time: 9:28 End Time: 9:42 Fluoro Time: 36 seconds  RN/CMA Designer, multimediahumaker RN Mark Yerian RN    Time 8:45 9:45    BP 149/79 135/68    Pulse 91 85    Respirations 16 16    O2 Sat 94 94    S/S 6 6    Pain Level 6/10 0/10     D/C home with wife, [name] & O X 3, D/C instructions reviewed, and sits independently.

## 2015-03-09 NOTE — Patient Instructions (Signed)
MRI of the cervical spine was normal  Left L2 L3 L4 radiofrequency procedure completed  Has another month supply of Lyrica as well as Tylenol No. 3  Tylenol 3 will be continued for 1 more month then switch back to tramadol  We will continue Lyrica in place of gabapentin.  Please sign a records release to [facility]
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
## 2015-03-23 NOTE — Progress Notes (Signed)
Urine drug screen is inconsistent.  It is positive for alcohol. Also he is prescribed tramadol by Dr Terrace Arabia (one 50 mg tab q 12 hours bu gets #120 so may be meant to be 1 tab q 6hr) but his level is > 50,000 ng/mg and cut off is 200 ng/ml.Marland Kitchen  His tyl # 3 levels are high for codeine and metabolite.  Unless there is a metabolism problem these levels seem high for a 400 lb man.

## 2015-03-30 ENCOUNTER — Telehealth: Payer: Self-pay | Admitting: *Deleted

## 2015-03-30 NOTE — Progress Notes (Signed)
Erick Colace, MD   Sent: Caleen Essex March 23, 2015 3:32 PM    To: Doreene Eland, RN        Message     Please call and notify him that we will be able to prescribe narcotic analgesics given his alcohol use. I think the wife was trying to switch his care over to [facility] anyhow so it may be a moot point

## 2015-03-30 NOTE — Telephone Encounter (Signed)
Left message for Mark Rice or Mark Rice to call our office so that I may give him Dr Wynn Banker message that because of the alcohol in his urine drug screen, Dr Wynn Banker will no longer prescribe narcotic medication for him.  Awaiting call back.

## 2015-03-30 NOTE — Telephone Encounter (Signed)
Prior authorization for Lyrica has been approved GPI/NDC: [id number]

## 2015-04-02 NOTE — Telephone Encounter (Signed)
Notified Jamerion's wife Rolly Pancake.

## 2015-04-10 ENCOUNTER — Telehealth: Payer: Self-pay | Admitting: Physical Medicine & Rehabilitation

## 2015-04-10 NOTE — Telephone Encounter (Signed)
[facility] had to [facility] medications to [facility] [address] in  [place]--he is requesting his refills to be sent there.  Any questions please call.

## 2015-04-12 NOTE — Telephone Encounter (Signed)
(  Cannot locate pharmacy in Epic list) [facility]  [address], [place], [place] [address]   [phone]

## 2016-01-23 ENCOUNTER — Telehealth: Payer: Self-pay | Admitting: Neurology

## 2016-01-23 NOTE — Telephone Encounter (Signed)
Operator rec'd call from answering service. Atty York RamRyen Perry [phone] needs MR for DOS 11-16-14. Call transferred to Hoy MornBetty, Debra is on vaca today.

## 2016-06-23 ENCOUNTER — Telehealth: Payer: Self-pay | Admitting: Physical Medicine & Rehabilitation

## 2016-06-26 ENCOUNTER — Telehealth: Payer: Self-pay | Admitting: Physical Medicine & Rehabilitation

## 2016-06-26 NOTE — Telephone Encounter (Signed)
Patient's wife wanted to schedule an appointment for husband, but I explained to her that she would need to get the notes from whom he was seeing for past year [facility] W[facility]/rangersychologist) sent to us to see what Dr. Wynn BankerKirsteins can offer patient.
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
## 2016-08-15 NOTE — Telephone Encounter (Signed)
Closing encounter

## 2016-10-02 ENCOUNTER — Encounter: Payer: Medicaid Other | Attending: Physical Medicine & Rehabilitation

## 2016-10-02 ENCOUNTER — Ambulatory Visit (HOSPITAL_BASED_OUTPATIENT_CLINIC_OR_DEPARTMENT_OTHER): Payer: Medicaid Other | Admitting: Physical Medicine & Rehabilitation

## 2016-10-02 ENCOUNTER — Encounter: Payer: Self-pay | Admitting: Physical Medicine & Rehabilitation

## 2016-10-02 VITALS — BP 135/85 | HR 89 | Resp 14

## 2016-10-02 DIAGNOSIS — Z87820 Personal history of traumatic brain injury: Secondary | ICD-10-CM | POA: Diagnosis not present

## 2016-10-02 DIAGNOSIS — Z6841 Body Mass Index (BMI) 40.0 and over, adult: Secondary | ICD-10-CM | POA: Insufficient documentation

## 2016-10-02 DIAGNOSIS — R51 Headache: Secondary | ICD-10-CM | POA: Insufficient documentation

## 2016-10-02 DIAGNOSIS — M47817 Spondylosis without myelopathy or radiculopathy, lumbosacral region: Secondary | ICD-10-CM

## 2016-10-02 DIAGNOSIS — M47816 Spondylosis without myelopathy or radiculopathy, lumbar region: Secondary | ICD-10-CM | POA: Diagnosis present

## 2016-10-02 DIAGNOSIS — G8929 Other chronic pain: Secondary | ICD-10-CM | POA: Insufficient documentation

## 2016-10-02 DIAGNOSIS — Z833 Family history of diabetes mellitus: Secondary | ICD-10-CM | POA: Diagnosis not present

## 2016-10-02 DIAGNOSIS — Z87891 Personal history of nicotine dependence: Secondary | ICD-10-CM | POA: Diagnosis not present

## 2016-10-02 DIAGNOSIS — Z8249 Family history of ischemic heart disease and other diseases of the circulatory system: Secondary | ICD-10-CM | POA: Insufficient documentation

## 2016-10-02 DIAGNOSIS — M542 Cervicalgia: Secondary | ICD-10-CM | POA: Insufficient documentation

## 2016-10-02 MED ORDER — DIAZEPAM 10 MG PO TABS: 10.0000 mg | ORAL_TABLET | Freq: Once | ORAL | 0 refills | Status: AC

## 2016-10-02 MED ORDER — TRAMADOL HCL 50 MG PO TABS: 50.0000 mg | ORAL_TABLET | Freq: Two times a day (BID) | ORAL | 3 refills | Status: DC | PRN
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
## 2016-10-02 NOTE — Patient Instructions (Signed)
Take Valium just prior to injection

## 2016-10-30 ENCOUNTER — Encounter: Payer: Self-pay | Admitting: Physical Medicine & Rehabilitation

## 2016-10-30 ENCOUNTER — Encounter: Payer: Medicaid Other | Attending: Physical Medicine & Rehabilitation

## 2016-10-30 ENCOUNTER — Ambulatory Visit (HOSPITAL_BASED_OUTPATIENT_CLINIC_OR_DEPARTMENT_OTHER): Payer: Medicaid Other | Admitting: Physical Medicine & Rehabilitation

## 2016-10-30 VITALS — BP 158/101 | HR 99

## 2016-10-30 DIAGNOSIS — M542 Cervicalgia: Secondary | ICD-10-CM | POA: Diagnosis not present

## 2016-10-30 DIAGNOSIS — Z6841 Body Mass Index (BMI) 40.0 and over, adult: Secondary | ICD-10-CM | POA: Diagnosis not present

## 2016-10-30 DIAGNOSIS — Z833 Family history of diabetes mellitus: Secondary | ICD-10-CM | POA: Insufficient documentation

## 2016-10-30 DIAGNOSIS — M47817 Spondylosis without myelopathy or radiculopathy, lumbosacral region: Secondary | ICD-10-CM | POA: Diagnosis not present

## 2016-10-30 DIAGNOSIS — Z87820 Personal history of traumatic brain injury: Secondary | ICD-10-CM | POA: Diagnosis not present

## 2016-10-30 DIAGNOSIS — M47816 Spondylosis without myelopathy or radiculopathy, lumbar region: Secondary | ICD-10-CM | POA: Diagnosis present

## 2016-10-30 DIAGNOSIS — Z8249 Family history of ischemic heart disease and other diseases of the circulatory system: Secondary | ICD-10-CM | POA: Diagnosis not present

## 2016-10-30 DIAGNOSIS — G8929 Other chronic pain: Secondary | ICD-10-CM | POA: Insufficient documentation

## 2016-10-30 DIAGNOSIS — Z87891 Personal history of nicotine dependence: Secondary | ICD-10-CM | POA: Diagnosis not present

## 2016-10-30 DIAGNOSIS — R51 Headache: Secondary | ICD-10-CM | POA: Diagnosis not present

## 2016-10-30 NOTE — Patient Instructions (Signed)

## 2016-10-30 NOTE — Progress Notes (Signed)
RightL5 dorsal ramus., Right L4 and Right L3 medial branch radio frequency neurotomy under fluoroscopic guidance   Indication: Low back pain due to lumbar spondylosis which has been relieved on 2 occasions by greater than 50% by lumbar medial branch blocks at corresponding levels.  Informed consent was obtained after describing risks and benefits of the procedure with the patient, this includes bleeding, bruising, infection, paralysis and medication side effects. The patient wishes to proceed and has given written consent. The patient was placed in a prone position. The lumbar and sacral area was marked and prepped with Betadine. A 25-gauge 1-1/2 inch needle was inserted into the skin and subcutaneous tissue at 3 sites in one ML of 1% lidocaine was injected into each site. Then a 20-gauge 15 cm radio frequency needle with a 1 cm curved active tip was inserted targeting the Right S1 SAP/sacral ala junction. Bone contact was made and confirmed with lateral imaging.  motor stimulation at 2 Hz confirm proper needle location followed by injection of 1ml 2% MPF lidocaine. Then the Right L5 SAP/transverse process junction was targeted. Bone contact was made and confirmed with lateral imaging.  motor stimulation at 2 Hz confirm proper needle location followed by injection of 1ml 2% MPF lidocaine. Then the Right L4 SAP/transverse process junction was targeted. Bone contact was made and confirmed with lateral imaging. motor stimulation at 2 Hz confirm proper needle location followed by injection of 1ml 2% MPF lidocaine. Radio frequency lesion being at 80C for 90 seconds was performed. Needles were removed. Post procedure instructions and vital signs were performed. Patient tolerated procedure well. Followup appointment was given.   

## 2016-10-30 NOTE — Progress Notes (Signed)
  PROCEDURE RECORD [facility]   Name: Kalman JewelsDerric Araque DOB:1981-01-09 MRN: [id number]  Date:10/30/2016  Physician: Claudette LawsAndrew Kirsteins, MD    Nurse/CMA: Deajah Erkkila CMA  Allergies:  Allergies  Allergen Reactions  . Penicillins Anaphylaxis    Shortness of breath  . Iohexol Itching    Patient's throat became extremely itchy after contrast administration. 50mg  Benadryl was administered.    Consent Signed: Yes.    Is patient diabetic? No.  CBG today?  Pregnant: No. LMP: No LMP for male patient. (age 38-55)  Anticoagulants: no Anti-inflammatory: no Antibiotics: no  Procedure:radio frequency right l3-5 Position: Prone Start Time: 11:14am End Time:  Fluoro Time: 44s RN/CMA Brayn Eckstein CMA Camp Gopal CMA    Time 1100am 1140am    BP 158/101 153/94    Pulse 103 90    Respirations 16 16    O2 Sat 96 95    S/S 6 6    Pain Level 7/10 2/10     D/C home with wife thessa, [name] & O X 3, D/C instructions reviewed, and sits independently.

## 2016-11-27 ENCOUNTER — Ambulatory Visit: Payer: Medicaid Other | Admitting: Physical Medicine & Rehabilitation

## 2016-12-23 ENCOUNTER — Encounter: Payer: Self-pay | Admitting: Physical Medicine & Rehabilitation

## 2016-12-23 ENCOUNTER — Encounter: Payer: Medicaid Other | Attending: Physical Medicine & Rehabilitation

## 2016-12-23 ENCOUNTER — Ambulatory Visit (HOSPITAL_BASED_OUTPATIENT_CLINIC_OR_DEPARTMENT_OTHER): Payer: Medicaid Other | Admitting: Physical Medicine & Rehabilitation

## 2016-12-23 VITALS — BP 148/97 | HR 99

## 2016-12-23 DIAGNOSIS — Z833 Family history of diabetes mellitus: Secondary | ICD-10-CM | POA: Insufficient documentation

## 2016-12-23 DIAGNOSIS — M542 Cervicalgia: Secondary | ICD-10-CM | POA: Diagnosis not present

## 2016-12-23 DIAGNOSIS — R51 Headache: Secondary | ICD-10-CM | POA: Diagnosis not present

## 2016-12-23 DIAGNOSIS — Z6841 Body Mass Index (BMI) 40.0 and over, adult: Secondary | ICD-10-CM | POA: Insufficient documentation

## 2016-12-23 DIAGNOSIS — Z87891 Personal history of nicotine dependence: Secondary | ICD-10-CM | POA: Diagnosis not present

## 2016-12-23 DIAGNOSIS — Z87820 Personal history of traumatic brain injury: Secondary | ICD-10-CM | POA: Insufficient documentation

## 2016-12-23 DIAGNOSIS — G8929 Other chronic pain: Secondary | ICD-10-CM | POA: Insufficient documentation

## 2016-12-23 DIAGNOSIS — M47817 Spondylosis without myelopathy or radiculopathy, lumbosacral region: Secondary | ICD-10-CM | POA: Diagnosis not present

## 2016-12-23 DIAGNOSIS — Z8249 Family history of ischemic heart disease and other diseases of the circulatory system: Secondary | ICD-10-CM | POA: Diagnosis not present

## 2016-12-23 DIAGNOSIS — M47816 Spondylosis without myelopathy or radiculopathy, lumbar region: Secondary | ICD-10-CM | POA: Diagnosis present

## 2016-12-23 MED ORDER — TRAMADOL HCL 50 MG PO TABS: 50.0000 mg | ORAL_TABLET | Freq: Two times a day (BID) | ORAL | 3 refills | Status: DC | PRN

## 2016-12-23 NOTE — Progress Notes (Signed)
  PROCEDURE RECORD [facility]   Name: Michiel Sivley DOB:17-Nov-1980 MRN: [id number]  Date:12/23/2016  Physician: Claudette Laws, MD    Nurse/CMA: Wessling, CMA  Allergies:  Allergies  Allergen Reactions  . Penicillins Anaphylaxis    Shortness of breath  . Iohexol Itching    Patient's throat became extremely itchy after contrast administration.  Benadryl was administered.    Consent Signed: Yes.    Is patient diabetic? No.  CBG today? no  Pregnant: No. LMP: No LMP for male patient. (age 87-55)  Anticoagulants: no Anti-inflammatory: no15 Antibiotics: no  Procedure: left medial branch block L3-5  Position: Prone Start Time: 11:40am      End Time: 12:10pm        Fluoro Time: 1:04  RN/CMA Bright CMA Wessling, CMA    Time 1115am 12:17pm    BP 148/97 144/84    Pulse 99 96    Respirations 16 16    O2 Sat 95 952/10    S/S 6 6    Pain Level 7/10 2/10     D/C home with WIFE, [name] & O X 3, D/C instructions reviewed, and sits independently.

## 2016-12-23 NOTE — Patient Instructions (Signed)

## 2016-12-23 NOTE — Progress Notes (Signed)
Left L5 dorsal ramus., left L4 and left L3 medial branch radio frequency neurotomy under fluoroscopic guidance  Indication: Low back pain due to lumbar spondylosis which has been relieved on 2 occasions by greater than 50% by lumbar medial branch blocks at corresponding levels.  Informed consent was obtained after describing risks and benefits of the procedure with the patient, this includes bleeding, bruising, infection, paralysis and medication side effects. The patient wishes to proceed and has given written consent. The patient was placed in a prone position. The lumbar and sacral area was marked and prepped with Betadine. A 25-gauge 1-1/2 inch needle was inserted into the skin and subcutaneous tissue at 3 sites in one ML of 1% lidocaine was injected into each site. Then a 18-gauge 15 cm radio frequency needle with a 1 cm curved active tip was inserted targeting the left S1 SAP/sacral ala junction. Bone contact was made and confirmed with lateral imaging.  motor stimulation at 2 Hz confirm proper needle location followed by injection of one ML of 1% MPF lidocaine. Then the left L5 SAP/transverse process junction was targeted. Bone contact was made and confirmed with lateral imaging motor stimulation at 2 Hz confirm proper needle location followed by injection of one ML of the solution containing one ML of  1% MPF lidocaine. Then the left L4 SAP/transverse process junction was targeted. Bone contact was made and confirmed with lateral imaging. motor stimulation at 2 Hz confirm proper needle location followed by injection of one ML of the solution containing one ML of1% MPF lidocaine. Radio frequency lesion  at 80C for 90 seconds was performed. Needles were removed. Post procedure instructions and vital signs were performed. Patient tolerated procedure well. Followup appointment was given.  

## 2016-12-23 NOTE — Addendum Note (Signed)
Addended by: Doreene Eland on: 12/23/2016 09:26 AM   Modules accepted: Orders

## 2017-01-11 DIAGNOSIS — R29705 NIHSS score 5: Secondary | ICD-10-CM | POA: Diagnosis not present

## 2017-01-11 DIAGNOSIS — M6281 Muscle weakness (generalized): Secondary | ICD-10-CM | POA: Diagnosis not present

## 2017-01-11 DIAGNOSIS — Z0389 Encounter for observation for other suspected diseases and conditions ruled out: Secondary | ICD-10-CM | POA: Diagnosis not present

## 2017-01-11 DIAGNOSIS — G43909 Migraine, unspecified, not intractable, without status migrainosus: Secondary | ICD-10-CM | POA: Diagnosis not present

## 2017-01-11 DIAGNOSIS — G9340 Encephalopathy, unspecified: Secondary | ICD-10-CM | POA: Diagnosis not present

## 2017-01-11 DIAGNOSIS — R400 Somnolence: Secondary | ICD-10-CM | POA: Diagnosis not present

## 2017-01-12 DIAGNOSIS — I498 Other specified cardiac arrhythmias: Secondary | ICD-10-CM | POA: Diagnosis not present

## 2017-03-31 ENCOUNTER — Ambulatory Visit (HOSPITAL_BASED_OUTPATIENT_CLINIC_OR_DEPARTMENT_OTHER): Payer: Medicaid Other | Admitting: Physical Medicine & Rehabilitation

## 2017-03-31 ENCOUNTER — Encounter: Payer: Medicaid Other | Attending: Physical Medicine & Rehabilitation

## 2017-03-31 ENCOUNTER — Encounter: Payer: Self-pay | Admitting: Physical Medicine & Rehabilitation

## 2017-03-31 VITALS — BP 118/78 | HR 87

## 2017-03-31 DIAGNOSIS — Z8249 Family history of ischemic heart disease and other diseases of the circulatory system: Secondary | ICD-10-CM | POA: Insufficient documentation

## 2017-03-31 DIAGNOSIS — G8929 Other chronic pain: Secondary | ICD-10-CM | POA: Diagnosis not present

## 2017-03-31 DIAGNOSIS — M542 Cervicalgia: Secondary | ICD-10-CM | POA: Insufficient documentation

## 2017-03-31 DIAGNOSIS — Z87820 Personal history of traumatic brain injury: Secondary | ICD-10-CM | POA: Diagnosis not present

## 2017-03-31 DIAGNOSIS — R51 Headache: Secondary | ICD-10-CM | POA: Insufficient documentation

## 2017-03-31 DIAGNOSIS — M47816 Spondylosis without myelopathy or radiculopathy, lumbar region: Secondary | ICD-10-CM | POA: Diagnosis not present

## 2017-03-31 DIAGNOSIS — M47817 Spondylosis without myelopathy or radiculopathy, lumbosacral region: Secondary | ICD-10-CM

## 2017-03-31 DIAGNOSIS — Z6841 Body Mass Index (BMI) 40.0 and over, adult: Secondary | ICD-10-CM | POA: Insufficient documentation

## 2017-03-31 DIAGNOSIS — Z87891 Personal history of nicotine dependence: Secondary | ICD-10-CM | POA: Insufficient documentation

## 2017-03-31 DIAGNOSIS — M79601 Pain in right arm: Secondary | ICD-10-CM

## 2017-03-31 DIAGNOSIS — Z833 Family history of diabetes mellitus: Secondary | ICD-10-CM | POA: Insufficient documentation

## 2017-03-31 MED ORDER — TRAMADOL HCL 50 MG PO TABS: 50.0000 mg | ORAL_TABLET | Freq: Two times a day (BID) | ORAL | 3 refills | Status: DC | PRN

## 2017-03-31 MED ORDER — DIAZEPAM 10 MG PO TABS: 10.0000 mg | ORAL_TABLET | Freq: Once | ORAL | 0 refills | Status: AC
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
## 2017-03-31 NOTE — Patient Instructions (Signed)
Please check whether you have had an EMG of the right upper extremity. Typically, a neurologist or physical medicine and rehabilitation specialist. We'll provide this. If not, we can schedule one here or [facility]  Next visit here is to repeat the right L3, L4 medial branch and right L5 dorsal ramus radiofrequency neurotomy

## 2017-04-07 DIAGNOSIS — G4733 Obstructive sleep apnea (adult) (pediatric): Secondary | ICD-10-CM | POA: Diagnosis not present

## 2017-04-25 DIAGNOSIS — G4733 Obstructive sleep apnea (adult) (pediatric): Secondary | ICD-10-CM | POA: Diagnosis not present

## 2017-05-05 ENCOUNTER — Ambulatory Visit: Payer: Medicaid Other | Admitting: Physical Medicine & Rehabilitation

## 2017-05-12 ENCOUNTER — Encounter: Payer: Self-pay | Admitting: Physical Medicine & Rehabilitation

## 2017-05-12 ENCOUNTER — Encounter: Payer: Medicaid Other | Attending: Physical Medicine & Rehabilitation

## 2017-05-12 ENCOUNTER — Ambulatory Visit (HOSPITAL_BASED_OUTPATIENT_CLINIC_OR_DEPARTMENT_OTHER): Payer: Medicaid Other | Admitting: Physical Medicine & Rehabilitation

## 2017-05-12 VITALS — BP 132/89 | HR 81

## 2017-05-12 DIAGNOSIS — Z833 Family history of diabetes mellitus: Secondary | ICD-10-CM | POA: Insufficient documentation

## 2017-05-12 DIAGNOSIS — R51 Headache: Secondary | ICD-10-CM | POA: Insufficient documentation

## 2017-05-12 DIAGNOSIS — M542 Cervicalgia: Secondary | ICD-10-CM | POA: Insufficient documentation

## 2017-05-12 DIAGNOSIS — M47817 Spondylosis without myelopathy or radiculopathy, lumbosacral region: Secondary | ICD-10-CM

## 2017-05-12 DIAGNOSIS — Z87820 Personal history of traumatic brain injury: Secondary | ICD-10-CM | POA: Insufficient documentation

## 2017-05-12 DIAGNOSIS — G8929 Other chronic pain: Secondary | ICD-10-CM | POA: Insufficient documentation

## 2017-05-12 DIAGNOSIS — Z6841 Body Mass Index (BMI) 40.0 and over, adult: Secondary | ICD-10-CM | POA: Diagnosis not present

## 2017-05-12 DIAGNOSIS — Z8249 Family history of ischemic heart disease and other diseases of the circulatory system: Secondary | ICD-10-CM | POA: Insufficient documentation

## 2017-05-12 DIAGNOSIS — Z87891 Personal history of nicotine dependence: Secondary | ICD-10-CM | POA: Diagnosis not present

## 2017-05-12 DIAGNOSIS — M47816 Spondylosis without myelopathy or radiculopathy, lumbar region: Secondary | ICD-10-CM | POA: Insufficient documentation

## 2017-05-12 NOTE — Progress Notes (Addendum)
RightL5 dorsal ramus., Right L4 and Right L3 medial branch radio frequency neurotomy under fluoroscopic guidance   Indication: Low back pain due to lumbar spondylosis which has been relieved on 2 occasions by greater than 50% by lumbar medial branch blocks at corresponding levels.  Informed consent was obtained after describing risks and benefits of the procedure with the patient, this includes bleeding, bruising, infection, paralysis and medication side effects. The patient wishes to proceed and has given written consent. The patient was placed in a prone position. The lumbar and sacral area was marked and prepped with Betadine. A 25-gauge 1-1/2 inch needle was inserted into the skin and subcutaneous tissue at 3 sites in one ML of 1% lidocaine was injected into each site. Then a 20-gauge 15 cm radio frequency needle with a 1 cm curved active tip was inserted targeting the Right S1 SAP/sacral ala junction. Bone contact was made and confirmed with lateral imaging.  motor stimulation at 2 Hz confirm proper needle location followed by injection of 1ml 2% MPF lidocaine. Then the Right L5 SAP/transverse process junction was targeted. Bone contact was made and confirmed with lateral imaging.  motor stimulation at 2 Hz confirm proper needle location followed by injection of 1ml 2% MPF lidocaine. Then the Right L4 SAP/transverse process junction was targeted. Bone contact was made and confirmed with lateral imaging. motor stimulation at 2 Hz confirm proper needle location followed by injection of 1ml 2% MPF lidocaine. Radio frequency lesion being at [facility] for 90 seconds was performed. Needles were removed. Post procedure instructions and vital signs were performed. Patient tolerated procedure well. Followup appointment was given.  Would recommend repeat Left L3,L4 medial branch and Left L 5 Dorsal ramus radiofrequency neurotomy on or after 07/03/2017

## 2017-05-12 NOTE — Progress Notes (Signed)
  PROCEDURE RECORD [facility]   Name: Mark Rice DOB:1981-02-21 MRN: [id number]  Date:05/12/2017  Physician: Claudette Laws, MD    Nurse/CMA: Zonnie Landen CMA  Allergies:  Allergies  Allergen Reactions  . Penicillins Anaphylaxis    Shortness of breath  . Iohexol Itching    Patient's throat became extremely itchy after contrast administration.  Benadryl was administered. Patient's throat became extremely itchy after contrast administration.  Benadryl was administered. Patient's throat became extremely itchy after contrast administration.  Benadryl was administered.    Consent Signed: Yes.    Is patient diabetic? No.  CBG today? NA  Pregnant: No. LMP: No LMP for male patient. (age 19-55)  Anticoagulants: no Anti-inflammatory: yes (motrin none in 4 days) Antibiotics: no  Procedure: Right RFA L3-5 Position: Prone   Start Time: 935am   End Time: 955am  Fluoro Time: 79s  RN/CMA Dhilan Brauer CMA Raina Sole CMA    Time 904am 1000am    BP 132/89 152/101    Pulse 81 77    Respirations 16 16    O2 Sat 96 96    S/S 6 6    Pain Level 6/10 4/10     D/C home with Wife , [name] & O X 3, D/C instructions reviewed, and sits independently.

## 2017-05-12 NOTE — Patient Instructions (Signed)
You had a radio frequency procedure today This was done to alleviate joint pain in your lumbar area We injected a combination of dexamethasone which is a steroid as well as lidocaine which is a local anesthetic. Dexamethasone made increased blood sugars you are diabetic You may experience soreness at the injection sites. You may also experienced some irritation of the nerves that were heated I'm recommending ice for 30 minutes every 2 hours as needed for the next 24-48 hours   

## 2017-07-29 ENCOUNTER — Telehealth: Payer: Self-pay | Admitting: Physical Medicine & Rehabilitation

## 2017-09-01 ENCOUNTER — Ambulatory Visit: Payer: Medicaid Other | Admitting: Physical Medicine & Rehabilitation

## 2017-09-01 ENCOUNTER — Encounter: Payer: Medicaid Other | Attending: Physical Medicine & Rehabilitation

## 2017-09-01 ENCOUNTER — Encounter: Payer: Self-pay | Admitting: Physical Medicine & Rehabilitation

## 2017-09-01 VITALS — BP 130/82 | HR 101 | Resp 14

## 2017-09-01 DIAGNOSIS — M47817 Spondylosis without myelopathy or radiculopathy, lumbosacral region: Secondary | ICD-10-CM

## 2017-09-01 MED ORDER — TRAMADOL HCL 50 MG PO TABS: 50.0000 mg | ORAL_TABLET | Freq: Two times a day (BID) | ORAL | 3 refills | Status: DC | PRN

## 2017-09-01 NOTE — Progress Notes (Signed)
  PROCEDURE RECORD [facility]   Name: Mark Rice DOB:May 26, 1981 MRN: [id number]  Date:09/01/2017  Physician: Claudette LawsAndrew Kirsteins, MD    Nurse/CMA: Primus Gritton, CMA  Allergies:  Allergies  Allergen Reactions  . Penicillins Anaphylaxis    Shortness of breath  . Iohexol Itching    Patient's throat became extremely itchy after contrast administration. 50mg  Benadryl was administered. Patient's throat became extremely itchy after contrast administration. 50mg  Benadryl was administered. Patient's throat became extremely itchy after contrast administration. 50mg  Benadryl was administered.    Consent Signed: Yes.    Is patient diabetic? No.  CBG today?   Pregnant: No. LMP: No LMP for male patient. (age 39-55)  Anticoagulants: no Anti-inflammatory: no Antibiotics: no  Procedure: radiofrequency neurotomy  Position: Prone Start Time: 2:05pm     End Time: 2:26pm  Fluoro Time: 1:20s  RN/CMA Britney Captain, CMA Sora Vrooman, CMA    Time 1:40pm 2:32pm    BP 130/82 140/85    Pulse 101 102    Respirations 14 14    O2 Sat 94 95    S/S 6 6    Pain Level 6/10 6/10     D/C home with wife, [name] & O X 3, D/C instructions reviewed, and sits independently.

## 2017-09-01 NOTE — Progress Notes (Signed)
Left L5 dorsal ramus., left L4 and left L3 medial branch radio frequency neurotomy under fluoroscopic guidance  Indication: Low back pain due to lumbar spondylosis which has been relieved on 2 occasions by greater than 50% by lumbar medial branch blocks at corresponding levels.  Informed consent was obtained after describing risks and benefits of the procedure with the patient, this includes bleeding, bruising, infection, paralysis and medication side effects. The patient wishes to proceed and has given written consent. The patient was placed in a prone position. The lumbar and sacral area was marked and prepped with Betadine. A 25-gauge 1-1/2 inch needle was inserted into the skin and subcutaneous tissue at 3 sites in one ML of 1% lidocaine was injected into each site. Then a 18-gauge 15 cm radio frequency needle with a 1 cm curved active tip was inserted targeting the left S1 SAP/sacral ala junction. Bone contact was made and confirmed with lateral imaging.  motor stimulation at 2 Hz confirm proper needle location followed by injection of one ML of 1% MPF lidocaine. Then the left L5 SAP/transverse process junction was targeted. Bone contact was made and confirmed with lateral imaging motor stimulation at 2 Hz confirm proper needle location followed by injection of one ML of the solution containing one ML of  1% MPF lidocaine. Then the left L4 SAP/transverse process junction was targeted. Bone contact was made and confirmed with lateral imaging. motor stimulation at 2 Hz confirm proper needle location followed by injection of one ML of the solution containing one ML of1% MPF lidocaine. Radio frequency lesion  at 80C for 90 seconds was performed. Needles were removed. Post procedure instructions and vital signs were performed. Patient tolerated procedure well. Followup appointment was given.  

## 2017-09-01 NOTE — Patient Instructions (Signed)

## 2017-09-09 ENCOUNTER — Other Ambulatory Visit: Payer: Self-pay | Admitting: Physical Medicine & Rehabilitation

## 2017-09-11 ENCOUNTER — Telehealth: Payer: Self-pay

## 2017-09-11 MED ORDER — TRAMADOL HCL 50 MG PO TABS: 50.0000 mg | ORAL_TABLET | Freq: Two times a day (BID) | ORAL | 3 refills | Status: DC | PRN

## 2017-09-11 NOTE — Telephone Encounter (Signed)
Mark Rice called stating that patient was given a prescription for tramadol on 09/01/17. She is requesting that our office call in the prescription to [facility] at [phone].

## 2017-09-29 NOTE — Telephone Encounter (Signed)
error 

## 2017-10-16 ENCOUNTER — Encounter: Payer: Medicaid Other | Attending: Physical Medicine & Rehabilitation

## 2017-10-16 ENCOUNTER — Encounter: Payer: Self-pay | Admitting: Physical Medicine & Rehabilitation

## 2017-10-16 ENCOUNTER — Ambulatory Visit (HOSPITAL_BASED_OUTPATIENT_CLINIC_OR_DEPARTMENT_OTHER): Payer: Medicaid Other | Admitting: Physical Medicine & Rehabilitation

## 2017-10-16 VITALS — BP 129/82 | HR 90

## 2017-10-16 DIAGNOSIS — M47817 Spondylosis without myelopathy or radiculopathy, lumbosacral region: Secondary | ICD-10-CM | POA: Insufficient documentation

## 2017-10-16 NOTE — Progress Notes (Signed)
Subjective:    Patient ID: Mark Rice, male    DOB: Feb 21, 1981, 37 y.o.   MRN: [id number]  HPI 37 year old male with lumbar spondylosis is primary pain generator as documented by response to lumbar medial branch blocks at L3-4 and L5 dorsal ramus injection under fluoroscopic guidance on at least 2 occasions.  He has had good response to radiofrequency neurotomy at the same levels and had last procedure performed on the left side on 09/01/2017.  He has had very good relief with his low back pain.  He is not taking tramadol on a regular basis anymore Right-sided low back pain is starting to increase however.  His last radiofrequency was September 2018.  Pain Inventory Average Pain 6 Pain Right Now 6 My pain is tingling and aching  In the last 24 hours, has pain interfered with the following? General activity 5 Relation with others 5 Enjoyment of life 5 What TIME of day is your pain at its worst? daytime Sleep (in general) Fair  Pain is worse with: bending, standing and some activites Pain improves with: medication Relief from Meds: 6  Mobility walk without assistance ability to climb steps?  yes do you drive?  yes  Function not employed: date last employed . I need assistance with the following:  dressing  Neuro/Psych weakness trouble walking  Prior Studies Any changes since last visit?  no  Physicians involved in your care Any changes since last visit?  no   Family History  Problem Relation Age of Onset  . Hypertension Mother   . Spondylolysis Mother   . Hypercholesterolemia Mother   . Diabetes Father    Social History   Socioeconomic History  . Marital status: Married    Spouse name: Not on file  . Number of children: 4  . Years of education: 3  . Highest education level: Not on file  Social Needs  . Financial resource strain: Not on file  . Food insecurity - worry: Not on file  . Food insecurity - inability: Not on file  . Transportation needs -  medical: Not on file  . Transportation needs - non-medical: Not on file  Occupational History  . Occupation: Company secretary  Tobacco Use  . Smoking status: Former Smoker    Packs/day: 0.25    Types: Cigarettes    Last attempt to quit: 06/14/2012    Years since quitting: 5.3  . Smokeless tobacco: Never Used  Substance and Sexual Activity  . Alcohol use: Yes    Alcohol/week: 0.0 oz    Comment: occ  . Drug use: No  . Sexual activity: Not on file  Other Topics Concern  . Not on file  Social History Narrative   Lives at home with his wife.   Right-handed.   Occasional caffeine use.   Past Surgical History:  Procedure Laterality Date  . WISDOM TOOTH EXTRACTION     Past Medical History:  Diagnosis Date  . Back pain   . Depression   . Gall stones   . Head injury   . Neck pain   . Suicidal ideation    There were no vitals taken for this visit.  Opioid Risk Score:   Fall Risk Score:  `1  Depression screen PHQ 2/9  Depression screen [facility] 2/9 03/31/2017 12/04/2014  Decreased Interest 0 3  Down, Depressed, Hopeless 0 1  PHQ - 2 Score 0 4  Altered sleeping - 3  Tired, decreased energy - 2  Change in appetite -  2  Feeling bad or failure about yourself  - 0  Trouble concentrating - 1  Moving slowly or fidgety/restless - 1  Suicidal thoughts - 0  PHQ-9 Score - 13     Review of Systems  Constitutional: Negative.   HENT: Negative.   Eyes: Negative.   Respiratory: Negative.   Cardiovascular: Negative.   Gastrointestinal: Negative.   Endocrine: Negative.   Genitourinary: Negative.   Musculoskeletal: Positive for back pain, gait problem and myalgias.  Skin: Negative.   Allergic/Immunologic: Negative.   Hematological: Negative.   Psychiatric/Behavioral: Negative.   All other systems reviewed and are negative.      Objective:   Physical Exam  Constitutional: He is oriented to person, place, and time. He appears well-developed and well-nourished. No distress.    HENT:  Head: Normocephalic and atraumatic.  Eyes: Conjunctivae are normal. Pupils are equal, round, and reactive to light.  Musculoskeletal:  Lumbar range of motion is 50% flexion extension lateral bending and rotation.  Right-sided lateral bending is the most painful for him.  Neurological: He is alert and oriented to person, place, and time. Gait normal.  Motor strength is 5/5 bilateral hip flexor knee extensor ankle dorsiflexor Gait without evidence of toe drag or knee instability  Skin: He is not diaphoretic.  Psychiatric: His affect is blunt.  Nursing note and vitals reviewed.         Assessment & Plan:  #1.  Lumbar spondylosis without myelopathy.  Right-sided lumbar pain has been increasing over the last several weeks. Good response with left-sided repeat radiofrequency neurotomy 09/01/2017. Recommend repeat right-sided L3-L4 medial branch and right L5 dorsal ramus radiofrequency neurotomy in 1 month.  Discussed with patient agrees with plan.

## 2017-10-16 NOTE — Patient Instructions (Signed)
Will repeat Right sided radiofrequency next month

## 2017-11-13 ENCOUNTER — Encounter: Payer: Medicaid Other | Attending: Physical Medicine & Rehabilitation

## 2017-11-13 ENCOUNTER — Encounter: Payer: Self-pay | Admitting: Physical Medicine & Rehabilitation

## 2017-11-13 ENCOUNTER — Ambulatory Visit: Payer: Medicaid Other | Admitting: Physical Medicine & Rehabilitation

## 2017-11-13 VITALS — BP 138/100 | HR 99

## 2017-11-13 DIAGNOSIS — M47817 Spondylosis without myelopathy or radiculopathy, lumbosacral region: Secondary | ICD-10-CM | POA: Diagnosis not present

## 2017-11-13 NOTE — Progress Notes (Signed)
RightL5 dorsal ramus., Right L4 and Right L3 medial branch radio frequency neurotomy under fluoroscopic guidance   Indication: Low back pain due to lumbar spondylosis which has been relieved on 2 occasions by greater than 50% by lumbar medial branch blocks at corresponding levels.  Informed consent was obtained after describing risks and benefits of the procedure with the patient, this includes bleeding, bruising, infection, paralysis and medication side effects. The patient wishes to proceed and has given written consent. The patient was placed in a prone position. The lumbar and sacral area was marked and prepped with Betadine. A 25-gauge 1-1/2 inch needle was inserted into the skin and subcutaneous tissue at 3 sites in one ML of 1% lidocaine was injected into each site. Then a 20-gauge 15 cm radio frequency needle with a 1 cm curved active tip was inserted targeting the Right S1 SAP/sacral ala junction. Bone contact was made and confirmed with lateral imaging.  motor stimulation at 2 Hz confirm proper needle location followed by injection of 1ml 2% MPF lidocaine. Then the Right L5 SAP/transverse process junction was targeted. Bone contact was made and confirmed with lateral imaging.  motor stimulation at 2 Hz confirm proper needle location followed by injection of 1ml 2% MPF lidocaine. Then the Right L4 SAP/transverse process junction was targeted. Bone contact was made and confirmed with lateral imaging. motor stimulation at 2 Hz confirm proper needle location followed by injection of 1ml 2% MPF lidocaine. Radio frequency lesion being at 80C for 90 seconds was performed. Needles were removed. Post procedure instructions and vital signs were performed. Patient tolerated procedure well. Followup appointment was given.   

## 2017-11-13 NOTE — Progress Notes (Signed)
  PROCEDURE RECORD [facility]   Name: Kalman JewelsDerric Zagami DOB:1980/12/08 MRN: [id number]  Date:11/13/2017  Physician: Claudette LawsAndrew Kirsteins, MD    Nurse/CMA: Bright CMA / Wessling CMA  Allergies:  Allergies  Allergen Reactions  . Penicillins Anaphylaxis    Shortness of breath  . Iohexol Itching    Patient's throat became extremely itchy after contrast administration. 50mg  Benadryl was administered. Patient's throat became extremely itchy after contrast administration. 50mg  Benadryl was administered. Patient's throat became extremely itchy after contrast administration. 50mg  Benadryl was administered.    Consent Signed: Yes.    Is patient diabetic? No.  CBG today? NA   Pregnant: No. LMP: No LMP for male patient. (age 39-55)  Anticoagulants: no Anti-inflammatory: yes (yesterday morning) Antibiotics: no  Procedure: Right L3-5 RFA  Position: Prone   Start Time:  10:00 am         End Time:  10:16am            Fluoro Time: 49   RN/CMA Bright CMA Wessling CMA    Time 9:41 am 10:22am    BP 138/100 133/88    Pulse 99 90    Respirations 16 16    O2 Sat 95 96    S/S 6 6    Pain Level 5/10 2/10     D/C home with Wife , [name] & O X 3, D/C instructions reviewed, and sits independently.

## 2017-11-13 NOTE — Patient Instructions (Signed)

## 2017-11-27 ENCOUNTER — Telehealth: Payer: Self-pay | Admitting: *Deleted

## 2017-11-27 NOTE — Telephone Encounter (Signed)
Prior authorization submitted and approved for Tramadol 50 mg.

## 2017-12-28 ENCOUNTER — Encounter: Payer: Medicaid Other | Attending: Physical Medicine & Rehabilitation

## 2017-12-28 ENCOUNTER — Encounter: Payer: Self-pay | Admitting: Physical Medicine & Rehabilitation

## 2017-12-28 ENCOUNTER — Ambulatory Visit: Payer: Medicaid Other | Admitting: Physical Medicine & Rehabilitation

## 2017-12-28 VITALS — BP 161/103 | HR 98 | Ht 73.0 in | Wt >= 6400 oz

## 2017-12-28 DIAGNOSIS — M47817 Spondylosis without myelopathy or radiculopathy, lumbosacral region: Secondary | ICD-10-CM | POA: Diagnosis present

## 2017-12-28 MED ORDER — TRAMADOL HCL 50 MG PO TABS: 50.0000 mg | ORAL_TABLET | Freq: Two times a day (BID) | ORAL | 3 refills | Status: DC | PRN

## 2017-12-28 NOTE — Progress Notes (Signed)
Subjective:    Patient ID: Mark Rice, male    DOB: 1981-06-01, 37 y.o.   MRN: [id number]  HPI  Chief complaint: Left-sided low back pain 37 year old male with history of morbid obesity who has chronic low back pain.  He has had L3-L4 medial branch and L5 dorsal ramus medial branch blocks resulting in greater than 50% relief on a short-term basis followed by lumbar radiofrequency at those same levels last performed on the right side on 11/13/2017 and on the left side on 09/01/2017. He is complaining of left-sided low back pain.  It is a similar tingling and aching as the previously treated areas of his lumbar spine however it is up higher on the left side of the lumbar spine.  He denies any falls or trauma.  He denies any leg or thigh or foot numbness tingling or weakness.  No new bowel or bladder dysfunction. The patient is wondering whether his weight may be contributing to his pain. Pain Inventory Average Pain 8 Pain Right Now 8 My pain is tingling and aching  In the last 24 hours, has pain interfered with the following? General activity 7 Relation with others 7 Enjoyment of life 7 What TIME of day is your pain at its worst? . Sleep (in general) Fair  Pain is worse with: bending, sitting and standing Pain improves with: heat/ice and therapy/exercise Relief from Meds: .  Mobility walk without assistance use a cane ability to climb steps?  yes do you drive?  no  Function not employed: date last employed .  Neuro/Psych bladder control problems numbness tingling  Prior Studies Any changes since last visit?  no  Physicians involved in your care Any changes since last visit?  no   Family History  Problem Relation Age of Onset  . Hypertension Mother   . Spondylolysis Mother   . Hypercholesterolemia Mother   . Diabetes Father    Social History   Socioeconomic History  . Marital status: Married    Spouse name: Not on file  . Number of children: 4  . Years of  education: 59  . Highest education level: Not on file  Occupational History  . Occupation: Company secretary  Social Needs  . Financial resource strain: Not on file  . Food insecurity:    Worry: Not on file    Inability: Not on file  . Transportation needs:    Medical: Not on file    Non-medical: Not on file  Tobacco Use  . Smoking status: Former Smoker    Packs/day: 0.25    Types: Cigarettes    Last attempt to quit: 06/14/2012    Years since quitting: 5.5  . Smokeless tobacco: Never Used  Substance and Sexual Activity  . Alcohol use: Yes    Alcohol/week: 0.0 oz    Comment: occ  . Drug use: No  . Sexual activity: Not on file  Lifestyle  . Physical activity:    Days per week: Not on file    Minutes per session: Not on file  . Stress: Not on file  Relationships  . Social connections:    Talks on phone: Not on file    Gets together: Not on file    Attends religious service: Not on file    Active member of club or organization: Not on file    Attends meetings of clubs or organizations: Not on file    Relationship status: Not on file  Other Topics Concern  . Not on file  Social History Narrative   Lives at home with his wife.   Right-handed.   Occasional caffeine use.   Past Surgical History:  Procedure Laterality Date  . WISDOM TOOTH EXTRACTION     Past Medical History:  Diagnosis Date  . Back pain   . Depression   . Gall stones   . Head injury   . Neck pain   . Suicidal ideation    There were no vitals taken for this visit.  Opioid Risk Score:   Fall Risk Score:  `1  Depression screen PHQ 2/9  Depression screen [facility] 2/9 03/31/2017 12/04/2014  Decreased Interest 0 3  Down, Depressed, Hopeless 0 1  PHQ - 2 Score 0 4  Altered sleeping - 3  Tired, decreased energy - 2  Change in appetite - 2  Feeling bad or failure about yourself  - 0  Trouble concentrating - 1  Moving slowly or fidgety/restless - 1  Suicidal thoughts - 0  PHQ-9 Score - 13      Review of Systems  Constitutional: Negative.   HENT: Negative.   Eyes: Negative.   Respiratory: Negative.   Cardiovascular: Negative.   Gastrointestinal: Negative.   Endocrine: Negative.   Genitourinary: Negative.   Musculoskeletal: Positive for arthralgias, back pain, gait problem and myalgias.  Skin: Negative.   Allergic/Immunologic: Negative.   Neurological: Positive for numbness.  Hematological: Negative.   Psychiatric/Behavioral: Negative.   All other systems reviewed and are negative.      Objective:   Physical Exam  Constitutional: He is oriented to person, place, and time. He appears well-developed and well-nourished. No distress.  HENT:  Head: Normocephalic and atraumatic.  Eyes: Pupils are equal, round, and reactive to light. EOM are normal.  Neurological: He is alert and oriented to person, place, and time. No sensory deficit.  Skin: He is not diaphoretic.  Nursing note and vitals reviewed. Patient is tenderness palpation at L4 and L3 and L2 on the left side no tenderness at L5-S1 area bilaterally. Lumbar range of motion is reduced with reduced extension greater than flexion. Negative straight leg raising Gait is without evidence of toe drag or knee instability.        Assessment & Plan:  1.  Lumbar spondylosis without myelopathy.  We discussed the fact that obesity has on posture and that it can increase the load on the lumbar facet joints.  We also discussed that knee joints can also be affected by obesity. He will seek a bariatric medicine consult closer to home in [place] at [facility]. In terms of his left-sided low back pain I believe he has L2-3 and L3-4 facet joint involvement on the left side and therefore am recommending T12 L1-L2 medial branch blocks under fluoroscopic guidance. Patient states that he is rarely taking Ultram at this point he has not taken any for over a week.  He still has some pills left.  We discussed that if he wants  another prescription he will need to take a urine drug screen

## 2017-12-28 NOTE — Patient Instructions (Addendum)
Bariatric medicine specialist- [facility] Should help with back pain

## 2018-01-12 ENCOUNTER — Ambulatory Visit: Payer: Medicaid Other | Admitting: Physical Medicine & Rehabilitation

## 2018-01-12 ENCOUNTER — Encounter: Payer: Self-pay | Admitting: Physical Medicine & Rehabilitation

## 2018-01-12 VITALS — BP 144/97 | HR 96 | Ht 73.0 in | Wt >= 6400 oz

## 2018-01-12 DIAGNOSIS — M47817 Spondylosis without myelopathy or radiculopathy, lumbosacral region: Secondary | ICD-10-CM

## 2018-01-12 DIAGNOSIS — Z79891 Long term (current) use of opiate analgesic: Secondary | ICD-10-CM

## 2018-01-12 DIAGNOSIS — G894 Chronic pain syndrome: Secondary | ICD-10-CM

## 2018-01-12 DIAGNOSIS — Z5181 Encounter for therapeutic drug level monitoring: Secondary | ICD-10-CM

## 2018-01-12 NOTE — Patient Instructions (Signed)
Lumbar medial branch blocks were performed. This is to help diagnose the cause of the low back pain. It is important that you keep track of your pain for the first day or 2 after injection. This injection can give you temporary relief that lasts for hours or up to several months. There is no way to predict duration of pain relief.  Please try to compare your pain after injection to for the injection.  If this injection gives you  temporary relief there may be another longer-lasting procedure that may be beneficial call radiofrequency ablation  We did T12, L1,L2 on the Left today

## 2018-01-12 NOTE — Progress Notes (Signed)
  PROCEDURE RECORD [facility]   Name: Mark Rice DOB:January 26, 1981 MRN: [id number]  Date:01/12/2018  Physician: Claudette Laws, MD    Nurse/CMA: Leroy Kennedy  Allergies:  Allergies  Allergen Reactions  . Penicillins Anaphylaxis    Shortness of breath  . Iohexol Itching    Patient's throat became extremely itchy after contrast administration.  Benadryl was administered. Patient's throat became extremely itchy after contrast administration.  Benadryl was administered. Patient's throat became extremely itchy after contrast administration.  Benadryl was administered.    Consent Signed: Yes.    Is patient diabetic? No.  CBG today?   Pregnant: No. LMP: No LMP for male patient. (age 73-55)  Anticoagulants: no Anti-inflammatory: no Antibiotics: no  Procedure: Medial Branch Block Position: Prone   Start Time: 2:06pm   End Time: 2:15pm Fluoro Time: 54  RN/CMA Carlene Bickley,CMA Cordarrius Coad,CMA    Time 1:40pm  2:20    BP 144/97 146/94    Pulse 96 93    Respirations 14 14    O2 Sat 94 93    S/S 6/6 6/6    Pain Level 7/10 3/10     D/C home with wife Mark Rice, [name] & O X 3, D/C instructions reviewed, and sits independently.

## 2018-01-12 NOTE — Procedures (Signed)
Left T 12, L1, L2 medial branch blocks under fluoroscopic guidance  Indication: Lumbar pain which is not relieved by medication management or other conservative care and interfering with self-care and mobility.  Informed consent was obtained after describing risks and benefits of the procedure with the patient, this includes bleeding, bruising, infection, paralysis and medication side effects. The patient wishes to proceed and has given written consent. The patient was placed in a prone position. The lumbar area was marked and prepped with Betadine. One ML of 1% lidocaine was injected into each of 3 areas into the skin and subcutaneous tissue. Then a 22-gauge 5in spinal needle was inserted targeting the junction of the left L3 superior articular process /transverse process junction. Needle was advanced under fluoroscopic guidance. Bone contact was made. Omnipaque 180 was injected x0.5 mL demonstrating no intravascular uptake. Then a solution containing one ML of 4 mg per mL dexamethasone and 3 mL of 2% MPF lidocaine was injected x0.5 mL. Then the left L2 superior articular process in transverse process junction was targeted. Bone contact was made. Omnipaque 180 was injected x0.5 mL demonstrating no intravascular uptake. Then a solution containing one ML of 4 mg per mL dexamethasone and 3 mL of 2% MPF lidocaine was injected x0.5 mL. Then the left L1 superior articular process in transverse process junction was targeted. Bone contact was made. Omnipaque 180 was injected x0.5 mL demonstrating no intravascular uptake. Then a solution containing one ML of 4 mg per mL dexamethasone and 3 mL of 2% MPF lidocaine was injected x0.5 mL. Patient tolerated procedure well. Post procedure instructions were given. Please refer to post procedure form. 

## 2018-01-18 LAB — TOXASSURE SELECT,+ANTIDEPR,UR

## 2018-01-21 ENCOUNTER — Telehealth: Payer: Self-pay | Admitting: *Deleted

## 2018-01-21 NOTE — Telephone Encounter (Signed)
Urine drug screen for this encounter is consistent for prescribed medication 

## 2018-02-11 ENCOUNTER — Encounter: Payer: Medicaid Other | Attending: Physical Medicine & Rehabilitation

## 2018-02-11 ENCOUNTER — Ambulatory Visit: Payer: Medicaid Other | Admitting: Physical Medicine & Rehabilitation

## 2018-02-11 VITALS — BP 158/101 | HR 98 | Ht 72.0 in | Wt >= 6400 oz

## 2018-02-11 DIAGNOSIS — M47817 Spondylosis without myelopathy or radiculopathy, lumbosacral region: Secondary | ICD-10-CM | POA: Diagnosis present

## 2018-02-11 NOTE — Progress Notes (Signed)
  PROCEDURE RECORD [facility]   Name: Mark Rice DOB:15-Feb-1981 MRN: [id number]  Date:02/11/2018  Physician: Claudette LawsAndrew Kirsteins, MD    Nurse/CMA: Bright CMA  Allergies:  Allergies  Allergen Reactions  . Penicillins Anaphylaxis    Shortness of breath  . Iohexol Itching    Patient's throat became extremely itchy after contrast administration. 50mg  Benadryl was administered. Patient's throat became extremely itchy after contrast administration. 50mg  Benadryl was administered. Patient's throat became extremely itchy after contrast administration. 50mg  Benadryl was administered.    Consent Signed: Yes.    Is patient diabetic? No.  CBG today? NA  Pregnant: No. LMP: No LMP for male patient. (age 39-55)  Anticoagulants: no Anti-inflammatory: no Antibiotics: no  Procedure: Left T12, L1-2 MBB       Position: Prone   Start Time: 12:48pm End Time: 12:53pm Fluoro Time: 35s  RN/CMA Wessling/Bright CMA Bright CMA    Time 1232pm 1:000pm    BP 158/101 159/95    Pulse 98 94    Respirations 16 16    O2 Sat 94 95    S/S 6 6    Pain Level 6/10 3/10     D/C home with Wife, [name] & O X 3, D/C instructions reviewed, and sits independently.

## 2018-02-11 NOTE — Progress Notes (Signed)
Left T 12, L1, L2 medial branch blocks under fluoroscopic guidance  Indication: Lumbar pain which is not relieved by medication management or other conservative care and interfering with self-care and mobility.  Informed consent was obtained after describing risks and benefits of the procedure with the patient, this includes bleeding, bruising, infection, paralysis and medication side effects. The patient wishes to proceed and has given written consent. The patient was placed in a prone position. The lumbar area was marked and prepped with Betadine. One ML of 1% lidocaine was injected into each of 3 areas into the skin and subcutaneous tissue. Then a 22-gauge 5in spinal needle was inserted targeting the junction of the left L3 superior articular process /transverse process junction. Needle was advanced under fluoroscopic guidance. Bone contact was made. Omnipaque 180 was injected x0.5 mL demonstrating no intravascular uptake. Then a solution containing one ML of 4 mg per mL dexamethasone and 3 mL of 2% MPF lidocaine was injected x0.5 mL. Then the left L2 superior articular process in transverse process junction was targeted. Bone contact was made. Omnipaque 180 was injected x0.5 mL demonstrating no intravascular uptake. Then a solution containing one ML of 4 mg per mL dexamethasone and 3 mL of 2% MPF lidocaine was injected x0.5 mL. Then the left L1 superior articular process in transverse process junction was targeted. Bone contact was made. Omnipaque 180 was injected x0.5 mL demonstrating no intravascular uptake. Then a solution containing one ML of 4 mg per mL dexamethasone and 3 mL of 2% MPF lidocaine was injected x0.5 mL. Patient tolerated procedure well. Post procedure instructions were given. Please refer to post procedure form. 

## 2018-02-11 NOTE — Patient Instructions (Signed)

## 2018-02-12 ENCOUNTER — Ambulatory Visit: Payer: Medicaid Other | Admitting: Physical Medicine & Rehabilitation

## 2018-03-12 ENCOUNTER — Ambulatory Visit: Payer: Medicaid Other | Admitting: Physical Medicine & Rehabilitation

## 2018-03-19 ENCOUNTER — Encounter: Payer: Medicaid Other | Attending: Physical Medicine & Rehabilitation

## 2018-03-19 ENCOUNTER — Ambulatory Visit (HOSPITAL_BASED_OUTPATIENT_CLINIC_OR_DEPARTMENT_OTHER): Payer: Medicaid Other | Admitting: Physical Medicine & Rehabilitation

## 2018-03-19 ENCOUNTER — Encounter: Payer: Self-pay | Admitting: Physical Medicine & Rehabilitation

## 2018-03-19 VITALS — BP 125/86 | HR 87 | Ht 72.0 in | Wt >= 6400 oz

## 2018-03-19 DIAGNOSIS — M47817 Spondylosis without myelopathy or radiculopathy, lumbosacral region: Secondary | ICD-10-CM | POA: Insufficient documentation

## 2018-03-19 NOTE — Progress Notes (Signed)
Left T12,L1,L2 medial branch radio frequency neurotomy under fluoroscopic guidance    Indication: Low back pain due to lumbar spondylosis which has been relieved on 2 occasions by greater than 50% by lumbar medial branch blocks at corresponding levels.  Informed consent was obtained after describing risks and benefits of the procedure with the patient, this includes bleeding, bruising, infection, paralysis and medication side effects. The patient wishes to proceed and has given written consent. The patient was placed in a prone position. The lumbar and sacral area was marked and prepped with Betadine. A 25-gauge 1-1/2 inch needle was inserted into the skin and subcutaneous tissue at 3 sites in one ML of 1% lidocaine was injected into each site. Then a 18-gauge 15 cm radio frequency needle with a 1 cm curved active tip was inserted targeting the Left L3 SAP/transverse process junction. Bone contact was made and confirmed with lateral imaging.  motor stimulation at 2 Hz confirm proper needle location followed by injection of one ML of the solution  1% MPF lidocaine. Then the Left L2 SAP/transverse process junction was targeted. Bone contact was made and confirmed with lateral imaging.  motor stimulation at 2 Hz confirm proper needle location followed by injection of one ML of the solution containing  1% MPF lidocaine. Then the Left L1 SAP/transverse process junction was targeted. Bone contact was made and confirmed with lateral imaging. Motor stim at 2 Hz followed by injection of 1ml 1% MPF lidocaine. Radio frequency lesion being at 80C for 90 seconds was performed. Needles were removed. Post procedure instructions and vital signs were performed. Patient tolerated procedure well. Followup appointment was given. 

## 2018-03-19 NOTE — Progress Notes (Signed)
  PROCEDURE RECORD [facility]   Name: Mark JewelsDerric Agudelo DOB:29-Jun-1981 MRN: [id number]  Date:03/19/2018  Physician: Claudette LawsAndrew Kirsteins, MD    Nurse/CMA: Wessling CMA / Bright CMA  Allergies:  Allergies  Allergen Reactions  . Penicillins Anaphylaxis    Shortness of breath  . Iohexol Itching    Patient's throat became extremely itchy after contrast administration. 50mg  Benadryl was administered. Patient's throat became extremely itchy after contrast administration. 50mg  Benadryl was administered. Patient's throat became extremely itchy after contrast administration. 50mg  Benadryl was administered.    Consent Signed: Yes.    Is patient diabetic? No.  CBG today? NA  Pregnant: No. LMP: No LMP for male patient. (age 918-55)  Anticoagulants: no Anti-inflammatory: yes (last week) Antibiotics: no  Procedure: Left T12, L1-2 RFA  Position: Prone   Start Time:  3:05pm      End Time: 330pm Fluoro Time: 62s  RN/CMA Bright CMA Wessling CMA    Time 246pm 3:27pm    BP 125/86 123/85    Pulse 87 90    Respirations 16 16    O2 Sat 95 95    S/S 6 6    Pain Level 8/10 4/10     D/C home with Wife , [name] & O X 3, D/C instructions reviewed, and sits independently.

## 2018-03-19 NOTE — Patient Instructions (Signed)

## 2018-05-20 ENCOUNTER — Encounter: Payer: Self-pay | Admitting: Physical Medicine & Rehabilitation

## 2018-05-20 ENCOUNTER — Other Ambulatory Visit: Payer: Self-pay

## 2018-05-20 ENCOUNTER — Ambulatory Visit (HOSPITAL_BASED_OUTPATIENT_CLINIC_OR_DEPARTMENT_OTHER): Payer: Medicaid Other | Admitting: Physical Medicine & Rehabilitation

## 2018-05-20 ENCOUNTER — Encounter: Payer: Medicaid Other | Attending: Physical Medicine & Rehabilitation

## 2018-05-20 VITALS — BP 106/67 | HR 101 | Ht 72.0 in | Wt >= 6400 oz

## 2018-05-20 DIAGNOSIS — M47817 Spondylosis without myelopathy or radiculopathy, lumbosacral region: Secondary | ICD-10-CM | POA: Insufficient documentation

## 2018-05-20 NOTE — Progress Notes (Signed)
Subjective:    Patient ID: Mark Rice, male    DOB: [birth year], 37 y.o.   MRN: [id number]  HPI 37 year old male with morbid obesity and lumbar spondylosis without myelopathy who is responded well on several occasions to L3-L4 medial branch L5 dorsal ramus injection under short-term basis as well as 6 months or more to radiofrequency neurotomy of the same levels.  His primary pain complaint today is right lower extremity pain which is increasing.  He had been doing very well and had been going back in the gym after he has had left T12 L1-L2 medial branch radiofrequency neurotomy performed on 03/19/2018. He is right-sided L3-L4 radiofrequency neurotomy L5 dorsal ramus radiofrequency neurotomy was performed on 11/13/2017 and the left L3-L4 medial branch L5 dorsal ramus radiofrequency procedure was performed on 09/01/2017   Pain Inventory Average Pain 6 Pain Right Now 6 My pain is sharp, stabbing and aching  In the last 24 hours, has pain interfered with the following? General activity 6 Relation with others 6 Enjoyment of life 6 What TIME of day is your pain at its worst? varies Sleep (in general) Fair  Pain is worse with: walking, bending, sitting, inactivity and standing Pain improves with: no answer Relief from Meds: no answer  Mobility use a cane ability to climb steps?  yes do you drive?  no  Function not employed: date last employed .  Neuro/Psych weakness numbness trouble walking  Prior Studies Any changes since last visit?  no  Physicians involved in your care Any changes since last visit?  no   Family History  Problem Relation Age of Onset  . Hypertension Mother   . Spondylolysis Mother   . Hypercholesterolemia Mother   . Diabetes Father    Social History   Socioeconomic History  . Marital status: Married    Spouse name: Not on file  . Number of children: 4  . Years of education: 1  . Highest education level: Not on file  Occupational History  .  Occupation: Company secretary  Social Needs  . Financial resource strain: Not on file  . Food insecurity:    Worry: Not on file    Inability: Not on file  . Transportation needs:    Medical: Not on file    Non-medical: Not on file  Tobacco Use  . Smoking status: Former Smoker    Packs/day: 0.25    Types: Cigarettes    Last attempt to quit: 06/14/2012    Years since quitting: 5.9  . Smokeless tobacco: Never Used  Substance and Sexual Activity  . Alcohol use: Yes    Alcohol/week: 0.0 standard drinks    Comment: occ  . Drug use: No  . Sexual activity: Not on file  Lifestyle  . Physical activity:    Days per week: Not on file    Minutes per session: Not on file  . Stress: Not on file  Relationships  . Social connections:    Talks on phone: Not on file    Gets together: Not on file    Attends religious service: Not on file    Active member of club or organization: Not on file    Attends meetings of clubs or organizations: Not on file    Relationship status: Not on file  Other Topics Concern  . Not on file  Social History Narrative   Lives at home with his wife.   Right-handed.   Occasional caffeine use.   Past Surgical History:  Procedure Laterality  Date  . WISDOM TOOTH EXTRACTION     Past Medical History:  Diagnosis Date  . Back pain   . Depression   . Gall stones   . Head injury   . Neck pain   . Suicidal ideation    BP 106/67   Pulse (!) 101   Ht 6' (1.829 m)   Wt (!) 427 lb 6.4 oz (193.9 kg)   SpO2 91%   BMI 57.97 kg/m   Opioid Risk Score:   Fall Risk Score:  `1  Depression screen PHQ 2/9  Depression screen [facility] 2/9 05/20/2018 03/31/2017 12/04/2014  Decreased Interest 0 0 3  Down, Depressed, Hopeless 0 0 1  PHQ - 2 Score 0 0 4  Altered sleeping - - 3  Tired, decreased energy - - 2  Change in appetite - - 2  Feeling bad or failure about yourself  - - 0  Trouble concentrating - - 1  Moving slowly or fidgety/restless - - 1  Suicidal thoughts - - 0    PHQ-9 Score - - 13    Review of Systems  Constitutional: Negative.   HENT: Negative.   Eyes: Negative.   Respiratory: Negative.   Cardiovascular: Negative.   Gastrointestinal: Negative.   Endocrine: Negative.   Genitourinary: Negative.   Musculoskeletal: Positive for gait problem.  Skin: Negative.   Allergic/Immunologic: Negative.   Neurological: Positive for weakness and numbness.  Hematological: Negative.   Psychiatric/Behavioral: Negative.   All other systems reviewed and are negative.      Objective:   Physical Exam  Constitutional: He is oriented to person, place, and time. He appears well-developed and well-nourished. No distress.  HENT:  Head: Normocephalic and atraumatic.  Eyes: Pupils are equal, round, and reactive to light. EOM are normal.  Neck: Normal range of motion.  Musculoskeletal: Normal range of motion.  There is no tenderness palpation in the lumbar paraspinals L1-L5 bilaterally.  There is some mild pain with right-sided bending in the right lower back area lumbosacral junction.  Neurological: He is alert and oriented to person, place, and time. He displays no atrophy. He exhibits normal muscle tone. Gait normal.  Motor strength is 5/5 bilateral hip flexor knee extensor ankle dorsiflexor Normal sensation bilateral lower extremities  Skin: Skin is warm and dry. He is not diaphoretic.  Psychiatric: He has a normal mood and affect.  Nursing note and vitals reviewed.         Assessment & Plan:  1.  Lumbar spondylosis without myelopathy pain generators include the L1-L2 L2-3 joints as well as bilateral L4-5 L5-S1 facet joint complexes.  He has had good results with radiofrequency neurotomy.  He is now more active he is barely taking any of his tramadol. He is starting to get some recrudescence of the facet pain coming from L4-5 L5-S1 on the right side.  We will schedule for repeat radiofrequency neurotomy of right L3-4 medial branch and right L5 dorsal  ramus in 1 month.

## 2018-06-15 ENCOUNTER — Encounter: Payer: Self-pay | Admitting: Physical Medicine & Rehabilitation

## 2018-06-15 ENCOUNTER — Encounter: Payer: Medicaid Other | Attending: Physical Medicine & Rehabilitation

## 2018-06-15 ENCOUNTER — Ambulatory Visit: Payer: Medicaid Other | Admitting: Physical Medicine & Rehabilitation

## 2018-06-15 VITALS — BP 165/110 | HR 95 | Resp 16 | Ht 72.0 in | Wt >= 6400 oz

## 2018-06-15 DIAGNOSIS — M47817 Spondylosis without myelopathy or radiculopathy, lumbosacral region: Secondary | ICD-10-CM | POA: Insufficient documentation

## 2018-06-15 NOTE — Progress Notes (Signed)
  PROCEDURE RECORD [facility]   Name: Mark Rice DOB:Feb 04, 1981 MRN: [id number]  Date:06/15/2018  Physician: Claudette Laws, MD    Nurse/CMA: Lindsi Bayliss, CMA  Allergies:  Allergies  Allergen Reactions  . Penicillins Anaphylaxis    Shortness of breath  . Iohexol Itching    Patient's throat became extremely itchy after contrast administration. 50mg  Benadryl was administered. Patient's throat became extremely itchy after contrast administration. 50mg  Benadryl was administered. Patient's throat became extremely itchy after contrast administration. 50mg  Benadryl was administered.    Consent Signed: Yes.    Is patient diabetic? No.  CBG today?   Pregnant: No. LMP: No LMP for male patient. (age 67-55)  Anticoagulants: no Anti-inflammatory: no Antibiotics: no  Procedure: right L3,4,5 radiofrequency Neurotomy  Position: Prone Start Time: 2:57pm  End Time: 3:11pm  Fluoro Time: 38s  RN/CMA Desi Rowe, CMA Mariette Cowley, CMA    Time 2:35pm 3:15pm    BP 165/110 155/107    Pulse 95 81    Respirations 16 16    O2 Sat 93 95    S/S 6 6    Pain Level 8/10 3/10     D/C home with wife [name] & O X 3, D/C instructions reviewed, and sits independently.

## 2018-06-15 NOTE — Progress Notes (Signed)
RightL5 dorsal ramus., Right L4 and Right L3 medial branch radio frequency neurotomy under fluoroscopic guidance   Indication: Low back pain due to lumbar spondylosis which has been relieved on 2 occasions by greater than 50% by lumbar medial branch blocks at corresponding levels.  Informed consent was obtained after describing risks and benefits of the procedure with the patient, this includes bleeding, bruising, infection, paralysis and medication side effects. The patient wishes to proceed and has given written consent. The patient was placed in a prone position. The lumbar and sacral area was marked and prepped with Betadine. A 25-gauge 1-1/2 inch needle was inserted into the skin and subcutaneous tissue at 3 sites in one ML of 1% lidocaine was injected into each site. Then a 20-gauge 15 cm radio frequency needle with a 1 cm curved active tip was inserted targeting the Right S1 SAP/sacral ala junction. Bone contact was made and confirmed with lateral imaging.  motor stimulation at 2 Hz confirm proper needle location followed by injection of 1ml 2% MPF lidocaine. Then the Right L5 SAP/transverse process junction was targeted. Bone contact was made and confirmed with lateral imaging.  motor stimulation at 2 Hz confirm proper needle location followed by injection of 1ml 2% MPF lidocaine. Then the Right L4 SAP/transverse process junction was targeted. Bone contact was made and confirmed with lateral imaging. motor stimulation at 2 Hz confirm proper needle location followed by injection of 1ml 2% MPF lidocaine. Radio frequency lesion being at 80C for 90 seconds was performed. Needles were removed. Post procedure instructions and vital signs were performed. Patient tolerated procedure well. Followup appointment was given.   

## 2018-06-15 NOTE — Patient Instructions (Signed)

## 2018-07-13 ENCOUNTER — Ambulatory Visit: Payer: Medicaid Other | Admitting: Physical Medicine & Rehabilitation

## 2018-07-13 ENCOUNTER — Encounter: Payer: Self-pay | Admitting: Physical Medicine & Rehabilitation

## 2018-07-13 ENCOUNTER — Encounter: Payer: Medicaid Other | Attending: Physical Medicine & Rehabilitation

## 2018-07-13 VITALS — BP 163/96 | HR 107 | Ht 72.0 in | Wt >= 6400 oz

## 2018-07-13 DIAGNOSIS — M47817 Spondylosis without myelopathy or radiculopathy, lumbosacral region: Secondary | ICD-10-CM | POA: Diagnosis not present

## 2018-07-13 NOTE — Progress Notes (Signed)
  PROCEDURE RECORD [facility]   Name: Mark Rice DOB:Feb 06, 1981 MRN: [id number]  Date:07/13/2018  Physician: Claudette LawsAndrew Kirsteins, MD    Nurse/CMA: Bright CMA  Allergies:  Allergies  Allergen Reactions  . Penicillins Anaphylaxis    Shortness of breath  . Iohexol Itching    Patient's throat became extremely itchy after contrast administration. 50mg  Benadryl was administered. Patient's throat became extremely itchy after contrast administration. 50mg  Benadryl was administered. Patient's throat became extremely itchy after contrast administration. 50mg  Benadryl was administered.    Consent Signed: Yes.    Is patient diabetic? No.  CBG today? NA  Pregnant: No. LMP: No LMP for male patient. (age 39-55)  Anticoagulants: no Anti-inflammatory: no Antibiotics: no  Procedure: LEFT L3-5 RFA   Position: Prone   Start Time: 3:24pm End Time: 342pm Fluoro Time: 89s  RN/CMA Bright CMA Bright CMA    Time 3:10pm 351pm    BP 163/96 163/109    Pulse 107 95    Respirations 16 16    O2 Sat 93 95    S/S 6 6    Pain Level 9/10 2/10     D/C home with Wife, [name] & O X 3, D/C instructions reviewed, and sits independently.

## 2018-07-13 NOTE — Patient Instructions (Signed)

## 2018-07-13 NOTE — Progress Notes (Signed)
Left L5 dorsal ramus., left L4 and left L3 medial branch radio frequency neurotomy under fluoroscopic guidance  Indication: Low back pain due to lumbar spondylosis which has been relieved on 2 occasions by greater than 50% by lumbar medial branch blocks at corresponding levels.  Informed consent was obtained after describing risks and benefits of the procedure with the patient, this includes bleeding, bruising, infection, paralysis and medication side effects. The patient wishes to proceed and has given written consent. The patient was placed in a prone position. The lumbar and sacral area was marked and prepped with Betadine. A 25-gauge 1-1/2 inch needle was inserted into the skin and subcutaneous tissue at 3 sites in one ML of 1% lidocaine was injected into each site. Then a 18-gauge 15 cm radio frequency needle with a 1 cm curved active tip was inserted targeting the left S1 SAP/sacral ala junction. Bone contact was made and confirmed with lateral imaging.  motor stimulation at 2 Hz confirm proper needle location followed by injection of one ML of 1% MPF lidocaine. Then the left L5 SAP/transverse process junction was targeted. Bone contact was made and confirmed with lateral imaging motor stimulation at 2 Hz confirm proper needle location followed by injection of one ML of the solution containing one ML of  1% MPF lidocaine. Then the left L4 SAP/transverse process junction was targeted. Bone contact was made and confirmed with lateral imaging. motor stimulation at 2 Hz confirm proper needle location followed by injection of one ML of the solution containing one ML of1% MPF lidocaine. Radio frequency lesion  at 80C for 90 seconds was performed. Needles were removed. Post procedure instructions and vital signs were performed. Patient tolerated procedure well. Followup appointment was given.  

## 2018-09-13 ENCOUNTER — Ambulatory Visit: Payer: Medicaid Other | Admitting: Physical Medicine & Rehabilitation

## 2018-09-13 ENCOUNTER — Encounter: Payer: Medicaid Other | Attending: Physical Medicine & Rehabilitation

## 2018-09-13 ENCOUNTER — Other Ambulatory Visit: Payer: Self-pay

## 2018-09-13 ENCOUNTER — Encounter: Payer: Self-pay | Admitting: Physical Medicine & Rehabilitation

## 2018-09-13 VITALS — BP 135/88 | HR 84 | Ht 72.0 in | Wt >= 6400 oz

## 2018-09-13 DIAGNOSIS — M47817 Spondylosis without myelopathy or radiculopathy, lumbosacral region: Secondary | ICD-10-CM

## 2018-09-13 NOTE — Progress Notes (Signed)
Subjective:    Patient ID: Mark Rice, male    DOB: 03/26/1981, 38 y.o.   MRN: [id number]  HPI  CC: Increasing Left sided mid back pain  38 yo male with hx of lumbar spondylosis causing severel low back pain who has undergone the following procedures with very good relief of low back and mid back pain  Nov 2019 Left L5 dorsal ramus., left L4 and left L3 medial branch radio frequency neurotomy under fluoroscopic guidance Oct 2019 RightL5 dorsal ramus., Right L4 and Right L3 medial branch radio frequency neurotomy under fluoroscopic guidance   July 2019 Left T12, L1, L2 RF was doing well with left sided mid back pain until Earlier this month    No longer taking tramadol or gabapentin except after procedure   Pain Inventory Average Pain 6 Pain Right Now 6 My pain is sharp, stabbing and aching  In the last 24 hours, has pain interfered with the following? General activity 6 Relation with others 6 Enjoyment of life 6 What TIME of day is your pain at its worst? varies Sleep (in general) Fair  Pain is worse with: some activites Pain improves with: medication Relief from Meds: na  Mobility use a cane  Function I need assistance with the following:  dressing  Neuro/Psych trouble walking  Prior Studies Any changes since last visit?  no  Physicians involved in your care Any changes since last visit?  no   Family History  Problem Relation Age of Onset  . Hypertension Mother   . Spondylolysis Mother   . Hypercholesterolemia Mother   . Diabetes Father    Social History   Socioeconomic History  . Marital status: Married    Spouse name: Not on file  . Number of children: 4  . Years of education: 54  . Highest education level: Not on file  Occupational History  . Occupation: Company secretary  Social Needs  . Financial resource strain: Not on file  . Food insecurity:    Worry: Not on file    Inability: Not on file  . Transportation needs:    Medical: Not  on file    Non-medical: Not on file  Tobacco Use  . Smoking status: Former Smoker    Packs/day: 0.25    Types: Cigarettes    Last attempt to quit: 06/14/2012    Years since quitting: 6.2  . Smokeless tobacco: Never Used  Substance and Sexual Activity  . Alcohol use: Yes    Alcohol/week: 0.0 standard drinks    Comment: occ  . Drug use: No  . Sexual activity: Not on file  Lifestyle  . Physical activity:    Days per week: Not on file    Minutes per session: Not on file  . Stress: Not on file  Relationships  . Social connections:    Talks on phone: Not on file    Gets together: Not on file    Attends religious service: Not on file    Active member of club or organization: Not on file    Attends meetings of clubs or organizations: Not on file    Relationship status: Not on file  Other Topics Concern  . Not on file  Social History Narrative   Lives at home with his wife.   Right-handed.   Occasional caffeine use.   Past Surgical History:  Procedure Laterality Date  . WISDOM TOOTH EXTRACTION     Past Medical History:  Diagnosis Date  . Back pain   .  Depression   . Gall stones   . Head injury   . Neck pain   . Suicidal ideation    BP 135/88   Pulse 84   Ht 6' (1.829 m)   Wt (!) 446 lb (202.3 kg)   SpO2 96%   BMI 60.49 kg/m   Opioid Risk Score:   Fall Risk Score:  `1  Depression screen PHQ 2/9  Depression screen [facility] 2/9 09/13/2018 05/20/2018 03/31/2017 12/04/2014  Decreased Interest 0 0 0 3  Down, Depressed, Hopeless 0 0 0 1  PHQ - 2 Score 0 0 0 4  Altered sleeping - - - 3  Tired, decreased energy - - - 2  Change in appetite - - - 2  Feeling bad or failure about yourself  - - - 0  Trouble concentrating - - - 1  Moving slowly or fidgety/restless - - - 1  Suicidal thoughts - - - 0  PHQ-9 Score - - - 13    Review of Systems  Constitutional: Negative.   HENT: Negative.   Eyes: Negative.   Respiratory: Negative.   Cardiovascular: Negative.     Gastrointestinal: Negative.   Endocrine: Negative.   Genitourinary: Negative.   Musculoskeletal: Positive for back pain and gait problem.  Skin: Negative.   Allergic/Immunologic: Negative.   Hematological: Negative.   Psychiatric/Behavioral: Negative.   All other systems reviewed and are negative.      Objective:   Physical Exam Vitals signs and nursing note reviewed.  Constitutional:      Appearance: Normal appearance. He is obese.  HENT:     Head: Normocephalic and atraumatic.     Nose: Nose normal.     Mouth/Throat:     Mouth: Mucous membranes are moist.  Eyes:     Extraocular Movements: Extraocular movements intact.     Conjunctiva/sclera: Conjunctivae normal.     Pupils: Pupils are equal, round, and reactive to light.  Musculoskeletal:     Thoracic back: He exhibits decreased range of motion and tenderness.     Lumbar back: He exhibits decreased range of motion.  Neurological:     General: No focal deficit present.     Mental Status: He is alert and oriented to person, place, and time.     Comments: Ambulates with cane, no toe drag or knee instability  Motor is 5/5 in BIlateral delt bi tri grip HF, KE, ADF    Sensation intact in BLE  Tender in Left Upper lumbar area No tenderness in lumbosacral area bilaterally     Assessment & Plan:  1.  Lumbar spondylosis without myelopathy.  Very good results with bilateral L3, L4 medial branch and L5 dorsal ramus RF treating lower lumbar pain  Pt also had good result with Left T12, L1, L2 RF treating Left upper lumbar pain but starting to wear off at 6 months post procedure- will schedule for repeat

## 2018-10-12 ENCOUNTER — Ambulatory Visit: Payer: Medicaid Other | Admitting: Physical Medicine & Rehabilitation

## 2018-10-12 ENCOUNTER — Encounter: Payer: Medicaid Other | Attending: Physical Medicine & Rehabilitation

## 2018-10-12 VITALS — Ht 72.0 in | Wt >= 6400 oz

## 2018-10-12 DIAGNOSIS — M47817 Spondylosis without myelopathy or radiculopathy, lumbosacral region: Secondary | ICD-10-CM | POA: Insufficient documentation

## 2018-10-12 NOTE — Patient Instructions (Signed)

## 2018-10-12 NOTE — Progress Notes (Signed)
Left T12,L1,L2 medial branch radio frequency neurotomy under fluoroscopic guidance    Indication: Low back pain due to lumbar spondylosis which has been relieved on 2 occasions by greater than 50% by lumbar medial branch blocks at corresponding levels.  Informed consent was obtained after describing risks and benefits of the procedure with the patient, this includes bleeding, bruising, infection, paralysis and medication side effects. The patient wishes to proceed and has given written consent. The patient was placed in a prone position. The lumbar and sacral area was marked and prepped with Betadine. A 25-gauge 1-1/2 inch needle was inserted into the skin and subcutaneous tissue at 3 sites in one ML of 1% lidocaine was injected into each site. Then a 18-gauge 15 cm radio frequency needle with a 1 cm curved active tip was inserted targeting the Left L3 SAP/transverse process junction. Bone contact was made and confirmed with lateral imaging.  motor stimulation at 2 Hz confirm proper needle location followed by injection of one ML of the solution  1% MPF lidocaine. Then the Left L2 SAP/transverse process junction was targeted. Bone contact was made and confirmed with lateral imaging.  motor stimulation at 2 Hz confirm proper needle location followed by injection of one ML of the solution containing  1% MPF lidocaine. Then the Left L1 SAP/transverse process junction was targeted. Bone contact was made and confirmed with lateral imaging. Motor stim at 2 Hz followed by injection of 71ml 1% MPF lidocaine. Radio frequency lesion being at [facility] for 90 seconds was performed. Needles were removed. Post procedure instructions and vital signs were performed. Patient tolerated procedure well. Followup appointment was given.

## 2018-10-12 NOTE — Progress Notes (Signed)
  PROCEDURE RECORD [facility]   Name: Mark Rice DOB:[birth year] MRN: [id number]  Date:10/12/2018  Physician: Claudette Laws, MD    Nurse/CMA: Nedra Hai, CMA  Allergies:  Allergies  Allergen Reactions  . Penicillins Anaphylaxis    Shortness of breath  . Iohexol Itching    Patient's throat became extremely itchy after contrast administration. 50mg  Benadryl was administered. Patient's throat became extremely itchy after contrast administration. 50mg  Benadryl was administered. Patient's throat became extremely itchy after contrast administration. 50mg  Benadryl was administered.    Consent Signed: Yes.    Is patient diabetic? No.  CBG today?   Pregnant: No. LMP: No LMP for male patient. (age 44-55)  Anticoagulants: no Anti-inflammatory: no Antibiotics: no  Procedure: Left T12,L1-2 Radiofrequency Neurotomy Position: Prone Start Time: 2:25pm End Time: 2:43pm Fluoro Time: 1:03  RN/CMA Nedra Hai, CMA Emmali Karow, CMA    Time 2:06pm 2:50pm    BP 164/90 162/105    Pulse 99 86    Respirations 14 14    O2 Sat 93 95    S/S 6 6    Pain Level 7/10 3/10     D/C home with wife, [name] & O X 3, D/C instructions reviewed, and sits independently.

## 2018-12-07 ENCOUNTER — Encounter: Payer: Self-pay | Admitting: Physical Medicine & Rehabilitation

## 2018-12-07 ENCOUNTER — Encounter: Payer: Medicaid Other | Attending: Physical Medicine & Rehabilitation

## 2018-12-07 ENCOUNTER — Ambulatory Visit (HOSPITAL_BASED_OUTPATIENT_CLINIC_OR_DEPARTMENT_OTHER): Payer: Medicaid Other | Admitting: Physical Medicine & Rehabilitation

## 2018-12-07 ENCOUNTER — Other Ambulatory Visit: Payer: Self-pay

## 2018-12-07 VITALS — BP 136/90 | Ht 72.0 in | Wt >= 6400 oz

## 2018-12-07 DIAGNOSIS — M47817 Spondylosis without myelopathy or radiculopathy, lumbosacral region: Secondary | ICD-10-CM | POA: Insufficient documentation

## 2018-12-07 NOTE — Progress Notes (Signed)
Subjective:    Patient ID: Mark Rice, male    DOB: 04-06-1981, 38 y.o.   MRN: [id number]  HPI 10/12/18 Left T12,L1,L2 medial branch radio frequency neurotomy under fluoroscopic guidance   07/13/18 Left L5 dorsal ramus., left L4 and left L3 medial branch radio frequency neurotomy under fluoroscopic guidance  06/15/2018 RightL5 dorsal ramus., Right L4 and Right L3 medial branch radio frequency neurotomy under fluoroscopic guidance   Chief complaint is Right sided low back pain Pt feels like th last right sided RF is wearing off Pain Inventory Average Pain 3 Pain Right Now 3 My pain is constant and sharp  In the last 24 hours, has pain interfered with the following? General activity 3 Relation with others 3 Enjoyment of life 3 What TIME of day is your pain at its worst? varies Sleep (in general) Fair  Pain is worse with: bending, sitting, standing and some activites Pain improves with: heat/ice, medication and TENS Relief from Meds: 5  Mobility how many minutes can you walk? 30 ability to climb steps?  yes do you drive?  no  Function not employed: date last employed na I need assistance with the following:  dressing, meal prep, household duties and shopping  Neuro/Psych spasms  Prior Studies Any changes since last visit?  no  Physicians involved in your care Any changes since last visit?  no   Family History  Problem Relation Age of Onset  . Hypertension Mother   . Spondylolysis Mother   . Hypercholesterolemia Mother   . Diabetes Father    Social History   Socioeconomic History  . Marital status: Married    Spouse name: Not on file  . Number of children: 4  . Years of education: 45  . Highest education level: Not on file  Occupational History  . Occupation: Company secretary  Social Needs  . Financial resource strain: Not on file  . Food insecurity:    Worry: Not on file    Inability: Not on file  . Transportation needs:    Medical: Not on file     Non-medical: Not on file  Tobacco Use  . Smoking status: Former Smoker    Packs/day: 0.25    Types: Cigarettes    Last attempt to quit: 06/14/2012    Years since quitting: 6.4  . Smokeless tobacco: Never Used  Substance and Sexual Activity  . Alcohol use: Yes    Alcohol/week: 0.0 standard drinks    Comment: occ  . Drug use: No  . Sexual activity: Not on file  Lifestyle  . Physical activity:    Days per week: Not on file    Minutes per session: Not on file  . Stress: Not on file  Relationships  . Social connections:    Talks on phone: Not on file    Gets together: Not on file    Attends religious service: Not on file    Active member of club or organization: Not on file    Attends meetings of clubs or organizations: Not on file    Relationship status: Not on file  Other Topics Concern  . Not on file  Social History Narrative   Lives at home with his wife.   Right-handed.   Occasional caffeine use.   Past Surgical History:  Procedure Laterality Date  . WISDOM TOOTH EXTRACTION     Past Medical History:  Diagnosis Date  . Back pain   . Depression   . Gall stones   .  Head injury   . Neck pain   . Suicidal ideation    BP 136/90 Comment: pt reported, virtual visit  Ht 6' (1.829 m)   Wt (!) 435 lb (197.3 kg)   BMI 59.00 kg/m   Opioid Risk Score:   Fall Risk Score:  `1  Depression screen PHQ 2/9  [facility] 12/07/2018 09/13/2018 05/20/2018 03/31/2017 12/04/2014  Decreased Interest 0 0 0 0 3  Down, Depressed, Hopeless 0 0 0 0 1  PHQ - 2 Score 0 0 0 0 4  Altered sleeping - - - - 3  Tired, decreased energy - - - - 2  Change in appetite - - - - 2  Feeling bad or failure about yourself  - - - - 0  Trouble concentrating - - - - 1  Moving slowly or fidgety/restless - - - - 1  Suicidal thoughts - - - - 0  PHQ-9 Score - - - - 13    Review of Systems  Constitutional: Negative.   HENT: Negative.   Eyes: Negative.   Respiratory: Negative.    Cardiovascular: Negative.   Gastrointestinal: Negative.   Endocrine: Negative.   Genitourinary: Negative.   Musculoskeletal: Positive for back pain.  Skin: Negative.   Allergic/Immunologic: Negative.   Neurological: Negative.   Hematological: Negative.   Psychiatric/Behavioral: Negative.   All other systems reviewed and are negative.      Objective:   Physical Exam  Speech without dysarthria Alert and able to participate in conversation without limitations Remainder of exam deferred due to phone visit      Assessment & Plan:  #1.  Lumbar spondylosis without myelopathy, the patient has had good results with the left-sided T12 L1-L2 radiofrequency neurotomy.  The right-sided L3-L4-L5 radiofrequency neurotomy is starting to wear off.  He will be 6 months post on 12/15/2018.  We will schedule for approximately 1 week after that. The patient is not taking his tramadol trying to minimize medications. He also states that he does not need the Valium for the procedure as he tolerated last seizure well without taking Valium

## 2018-12-21 ENCOUNTER — Ambulatory Visit: Payer: Medicaid Other | Admitting: Physical Medicine & Rehabilitation

## 2018-12-21 ENCOUNTER — Other Ambulatory Visit: Payer: Self-pay

## 2018-12-21 ENCOUNTER — Encounter: Payer: Self-pay | Admitting: Physical Medicine & Rehabilitation

## 2018-12-21 VITALS — BP 133/88 | HR 85 | Ht 72.0 in | Wt >= 6400 oz

## 2018-12-21 DIAGNOSIS — M47817 Spondylosis without myelopathy or radiculopathy, lumbosacral region: Secondary | ICD-10-CM

## 2018-12-21 MED ORDER — TRAMADOL HCL 50 MG PO TABS: 50.0000 mg | ORAL_TABLET | Freq: Every day | ORAL | 0 refills | Status: DC | PRN

## 2018-12-21 NOTE — Progress Notes (Signed)
  PROCEDURE RECORD [facility]   Name: Mark Rice DOB:1981/01/26 MRN: [id number]  Date:12/21/2018  Physician: Claudette Laws, MD    Nurse/CMA: Bright, CMA  Allergies:  Allergies  Allergen Reactions  . Penicillins Anaphylaxis    Shortness of breath  . Iohexol Itching    Patient's throat became extremely itchy after contrast administration. 50mg  Benadryl was administered. Patient's throat became extremely itchy after contrast administration. 50mg  Benadryl was administered. Patient's throat became extremely itchy after contrast administration. 50mg  Benadryl was administered.    Consent Signed: Yes.    Is patient diabetic? No.  CBG today?   Pregnant: No. LMP: No LMP for male patient. (age 45-55)  Anticoagulants: no Anti-inflammatory: no Antibiotics: no  Procedure: right L3,4,5 radiofrequency   Position: Prone   Start Time: 1121am End Time: 1137am Fluoro Time: 52s  RN/CMA Deandre Stansel, CMA Bright, CMA    Time 11:00am 1143am    BP 133/88 142/94    Pulse 85** 82    Respirations 16 16    O2 Sat 94 93    S/S 6 6    Pain Level 7/10 2/10     D/C home with wife, [name] & O X 3, D/C instructions reviewed, and sits independently.

## 2018-12-21 NOTE — Patient Instructions (Signed)
You had a radio frequency procedure today This was done to alleviate joint pain in your lumbar area We injected lidocaine which is a local anesthetic.  You may experience soreness at the injection sites. You may also experienced some irritation of the nerves that were heated I'm recommending ice for 30 minutes every 2 hours as needed for the next 24-48 hours You may double up on your Gabapentin today

## 2018-12-21 NOTE — Progress Notes (Signed)
RightL5 dorsal ramus., Right L4 and Right L3 medial branch radio frequency neurotomy under fluoroscopic guidance   Indication: Low back pain due to lumbar spondylosis which has been relieved on 2 occasions by greater than 50% by lumbar medial branch blocks at corresponding levels.  Informed consent was obtained after describing risks and benefits of the procedure with the patient, this includes bleeding, bruising, infection, paralysis and medication side effects. The patient wishes to proceed and has given written consent. The patient was placed in a prone position. The lumbar and sacral area was marked and prepped with Betadine. A 25-gauge 1-1/2 inch needle was inserted into the skin and subcutaneous tissue at 3 sites in one ML of 1% lidocaine was injected into each site. Then a 20-gauge 15 cm radio frequency needle with a 1 cm curved active tip was inserted targeting the Right S1 SAP/sacral ala junction. Bone contact was made and confirmed with lateral imaging.  motor stimulation at 2 Hz confirm proper needle location followed by injection of 1ml 2% MPF lidocaine. Then the Right L5 SAP/transverse process junction was targeted. Bone contact was made and confirmed with lateral imaging.  motor stimulation at 2 Hz confirm proper needle location followed by injection of 1ml 2% MPF lidocaine. Then the Right L4 SAP/transverse process junction was targeted. Bone contact was made and confirmed with lateral imaging. motor stimulation at 2 Hz confirm proper needle location followed by injection of 1ml 2% MPF lidocaine. Radio frequency lesion being at 80C for 90 seconds was performed. Needles were removed. Post procedure instructions and vital signs were performed. Patient tolerated procedure well. Followup appointment was given.   

## 2019-01-21 ENCOUNTER — Ambulatory Visit: Payer: Medicaid Other | Admitting: Physical Medicine & Rehabilitation

## 2019-01-24 ENCOUNTER — Other Ambulatory Visit: Payer: Self-pay

## 2019-01-24 ENCOUNTER — Encounter: Payer: Self-pay | Admitting: Physical Medicine & Rehabilitation

## 2019-01-24 ENCOUNTER — Encounter: Payer: Medicaid Other | Attending: Physical Medicine & Rehabilitation | Admitting: Physical Medicine & Rehabilitation

## 2019-01-24 VITALS — BP 145/96 | HR 74 | Temp 98.6°F | Ht 72.0 in | Wt >= 6400 oz

## 2019-01-24 DIAGNOSIS — M47817 Spondylosis without myelopathy or radiculopathy, lumbosacral region: Secondary | ICD-10-CM | POA: Insufficient documentation

## 2019-01-24 NOTE — Progress Notes (Signed)
  PROCEDURE RECORD [facility]   Name: Mark Rice DOB:[birth year] MRN: [id number]  Date:01/24/2019  Physician: Claudette Laws, MD    Nurse/CMA: Harlem Thresher CMA  Allergies:  Allergies  Allergen Reactions  . Penicillins Anaphylaxis    Shortness of breath  . Iohexol Itching    Patient's throat became extremely itchy after contrast administration. 50mg  Benadryl was administered. Patient's throat became extremely itchy after contrast administration. 50mg  Benadryl was administered. Patient's throat became extremely itchy after contrast administration. 50mg  Benadryl was administered.    Consent Signed: Yes.    Is patient diabetic? No.  CBG today? NA  Pregnant: No. LMP: No LMP for male patient. (age 55-55)  Anticoagulants: no Anti-inflammatory: no Antibiotics: no  Procedure: Left L3-5 RFA   Position: Prone   Start Time: 340pm End Time: 355pm Fluoro Time: 50s  RN/CMA Nayel Purdy CMA Jamyrah Saur CMA    Time 310pm 404pm    BP 145/96 168/104    Pulse 74 94    Respirations 16 16    O2 Sat 97 96    S/S 6 6    Pain Level 5/10 3/10     D/C home with Wife, [name] & O X 3, D/C instructions reviewed, and sits independently.

## 2019-01-24 NOTE — Progress Notes (Signed)
Left L5 dorsal ramus., left L4 and left L3 medial branch radio frequency neurotomy under fluoroscopic guidance  Indication: Low back pain due to lumbar spondylosis which has been relieved on 2 occasions by greater than 50% by lumbar medial branch blocks at corresponding levels.  Informed consent was obtained after describing risks and benefits of the procedure with the patient, this includes bleeding, bruising, infection, paralysis and medication side effects. The patient wishes to proceed and has given written consent. The patient was placed in a prone position. The lumbar and sacral area was marked and prepped with Betadine. A 25-gauge 1-1/2 inch needle was inserted into the skin and subcutaneous tissue at 3 sites in one ML of 1% lidocaine was injected into each site. Then a 18-gauge 15 cm radio frequency needle with a 1 cm curved active tip was inserted targeting the left S1 SAP/sacral ala junction. Bone contact was made and confirmed with lateral imaging.  motor stimulation at 2 Hz confirm proper needle location followed by injection of one ML of 1% MPF lidocaine. Then the left L5 SAP/transverse process junction was targeted. Bone contact was made and confirmed with lateral imaging motor stimulation at 2 Hz confirm proper needle location followed by injection of one ML of the solution containing one ML of  1% MPF lidocaine. Then the left L4 SAP/transverse process junction was targeted. Bone contact was made and confirmed with lateral imaging. motor stimulation at 2 Hz confirm proper needle location followed by injection of one ML of the solution containing one ML of1% MPF lidocaine. Radio frequency lesion  at 80C for 90 seconds was performed. Needles were removed. Post procedure instructions and vital signs were performed. Patient tolerated procedure well. Followup appointment was given.  

## 2019-04-26 ENCOUNTER — Encounter: Payer: Medicaid Other | Attending: Physical Medicine & Rehabilitation | Admitting: Physical Medicine & Rehabilitation

## 2019-04-26 DIAGNOSIS — M47817 Spondylosis without myelopathy or radiculopathy, lumbosacral region: Secondary | ICD-10-CM | POA: Insufficient documentation

## 2019-06-09 ENCOUNTER — Telehealth: Payer: Self-pay | Admitting: *Deleted

## 2019-06-09 NOTE — Telephone Encounter (Signed)
Patients wife left a message stating that patient fell. He has pain in his knee and side around rib cage.  She is asking if he can get his injection and evaluation at the same time.  I contacted the patient's wife and informed that his next appointment is a follow up. I explained Dr. Letta Pate does not package clinic visits and procedures together. I advised to come to visit for eval and determine what the next step is.

## 2019-06-16 ENCOUNTER — Ambulatory Visit: Payer: Medicaid Other | Admitting: Physical Medicine & Rehabilitation

## 2019-06-23 ENCOUNTER — Encounter: Payer: Medicaid Other | Admitting: Physical Medicine & Rehabilitation

## 2019-07-05 ENCOUNTER — Other Ambulatory Visit: Payer: Self-pay

## 2019-07-05 ENCOUNTER — Encounter: Payer: Self-pay | Admitting: Physical Medicine & Rehabilitation

## 2019-07-05 ENCOUNTER — Encounter: Payer: Medicaid Other | Attending: Physical Medicine & Rehabilitation | Admitting: Physical Medicine & Rehabilitation

## 2019-07-05 VITALS — BP 153/105 | HR 99 | Temp 97.5°F | Ht 72.0 in | Wt >= 6400 oz

## 2019-07-05 DIAGNOSIS — M47817 Spondylosis without myelopathy or radiculopathy, lumbosacral region: Secondary | ICD-10-CM | POA: Diagnosis not present

## 2019-07-05 NOTE — Progress Notes (Signed)
Subjective:    Patient ID: Mark Rice, male    DOB: Dec 07, 1980, 38 y.o.   MRN: [id number]  HPI   38 year old male with morbid obesity who has a history of lumbar spondylosis without myelopathy.  He has had good response with lumbar radiofrequency neurotomies L3-L4 and L5 dorsal ramus. No longer taking tramadol, more active Walks 5.5 miles per day Left RFA L3-28 January 2019-no recurrence of severe pain RIght RFA L3-28 November 2018-no recurrence of severe pain Left T12 L1-L2 medial branch radiofrequency neurotomy under fluoroscopic guidance 10/12/2018-no recurrence of severe pain  60% vegan, trying to lose weight with diet as well as exercise  Did not take BP meds today  Pain Inventory Average Pain 7 Pain Right Now 7 My pain is tingling and aching  In the last 24 hours, has pain interfered with the following? General activity 5 Relation with others 5 Enjoyment of life 5 What TIME of day is your pain at its worst? morning Sleep (in general) Fair  Pain is worse with: bending, sitting and standing Pain improves with: medication and injections Relief from Meds: .  Mobility walk without assistance walk with assistance ability to climb steps?  yes do you drive?  no  Function not employed: date last employed . I need assistance with the following:  dressing  Neuro/Psych weakness tingling  Prior Studies Any changes since last visit?  no  Physicians involved in your care Any changes since last visit?  no   Family History  Problem Relation Age of Onset  . Hypertension Mother   . Spondylolysis Mother   . Hypercholesterolemia Mother   . Diabetes Father    Social History   Socioeconomic History  . Marital status: Married    Spouse name: Not on file  . Number of children: 4  . Years of education: 47  . Highest education level: Not on file  Occupational History  . Occupation: Company secretary  Social Needs  . Financial resource strain: Not on file  . Food  insecurity    Worry: Not on file    Inability: Not on file  . Transportation needs    Medical: Not on file    Non-medical: Not on file  Tobacco Use  . Smoking status: Former Smoker    Packs/day: 0.25    Types: Cigarettes    Quit date: 06/14/2012    Years since quitting: 7.0  . Smokeless tobacco: Never Used  Substance and Sexual Activity  . Alcohol use: Yes    Alcohol/week: 0.0 standard drinks    Comment: occ  . Drug use: No  . Sexual activity: Not on file  Lifestyle  . Physical activity    Days per week: Not on file    Minutes per session: Not on file  . Stress: Not on file  Relationships  . Social Musician on phone: Not on file    Gets together: Not on file    Attends religious service: Not on file    Active member of club or organization: Not on file    Attends meetings of clubs or organizations: Not on file    Relationship status: Not on file  Other Topics Concern  . Not on file  Social History Narrative   Lives at home with his wife.   Right-handed.   Occasional caffeine use.   Past Surgical History:  Procedure Laterality Date  . WISDOM TOOTH EXTRACTION     Past Medical History:  Diagnosis Date  .  Back pain   . Depression   . Gall stones   . Head injury   . Neck pain   . Suicidal ideation    BP (!) 153/105   Pulse 99   Temp (!) 97.5 F (36.4 C)   Ht 6' (1.829 m)   Wt (!) 461 lb (209.1 kg)   SpO2 96%   BMI 62.52 kg/m   Opioid Risk Score:   Fall Risk Score:  `1  Depression screen PHQ 2/9  Depression screen [facility] 2/9 12/07/2018 09/13/2018 05/20/2018 03/31/2017 12/04/2014  Decreased Interest 0 0 0 0 3  Down, Depressed, Hopeless 0 0 0 0 1  PHQ - 2 Score 0 0 0 0 4  Altered sleeping - - - - 3  Tired, decreased energy - - - - 2  Change in appetite - - - - 2  Feeling bad or failure about yourself  - - - - 0  Trouble concentrating - - - - 1  Moving slowly or fidgety/restless - - - - 1  Suicidal thoughts - - - - 0  PHQ-9 Score - - - - 13      Review of Systems  Constitutional: Negative.   HENT: Negative.   Eyes: Negative.   Respiratory: Negative.   Cardiovascular: Negative.   Gastrointestinal: Negative.   Endocrine: Negative.   Genitourinary: Negative.   Musculoskeletal: Positive for arthralgias and back pain.  Skin: Negative.   Allergic/Immunologic: Negative.   Neurological: Positive for weakness and numbness.  Hematological: Negative.   Psychiatric/Behavioral: Negative.   All other systems reviewed and are negative.      Objective:   Physical Exam Vitals signs and nursing note reviewed.  Constitutional:      Appearance: Normal appearance. He is obese.  Eyes:     Extraocular Movements: Extraocular movements intact.     Conjunctiva/sclera: Conjunctivae normal.     Pupils: Pupils are equal, round, and reactive to light.  Skin:    General: Skin is warm and dry.  Neurological:     General: No focal deficit present.     Mental Status: He is alert and oriented to person, place, and time. Mental status is at baseline.  Psychiatric:        Mood and Affect: Mood normal.        Behavior: Behavior normal.   There is no tenderness palpation lumbar paraspinal muscles His lumbar range of motion has improved he is now approximately 75% flexion extension lateral bending and rotation without pain.        Assessment & Plan:  1.  Lumbar spondylosis, bilateral lower lumbar and right upper lumbar.  He has had good response with radiofrequency neurotomies as noted above.  At this point they are still effective.  We discussed that it is difficult to tell exactly how long they will last and he is to call when pain starts gradually worsening. He may need an office visit to determine which area specifically is bothering him. Overall I encouraged his weight loss efforts as well as his major improvement with his exercise program. Currently not taking tramadol so will not refill.

## 2019-07-05 NOTE — Patient Instructions (Signed)
Just call if pain is increasing

## 2019-09-19 DIAGNOSIS — Z0271 Encounter for disability determination: Secondary | ICD-10-CM

## 2020-02-09 ENCOUNTER — Other Ambulatory Visit: Payer: Self-pay

## 2020-02-09 ENCOUNTER — Encounter: Payer: Medicaid Other | Attending: Physical Medicine & Rehabilitation | Admitting: Physical Medicine & Rehabilitation

## 2020-02-09 ENCOUNTER — Encounter: Payer: Self-pay | Admitting: Physical Medicine & Rehabilitation

## 2020-02-09 VITALS — BP 159/112 | HR 89 | Temp 97.1°F | Ht 73.0 in | Wt >= 6400 oz

## 2020-02-09 DIAGNOSIS — M47817 Spondylosis without myelopathy or radiculopathy, lumbosacral region: Secondary | ICD-10-CM | POA: Diagnosis not present

## 2020-02-09 NOTE — Patient Instructions (Signed)
Left Lower back repeat RF first , then will do Left mid back the following month

## 2020-02-09 NOTE — Progress Notes (Signed)
Subjective:    Patient ID: Mark Rice, male    DOB: September 12, 1980, 39 y.o.   MRN: [id number]  HPI Chief complaint of left-sided low back pain.  Pain gradually increasing over the last several weeks.  Pain persist despite taking ibuprofen, gabapentin, Cymbalta.  Pain interferes with activities such as walking Left RFA L3-28 January 2019-+ recurrence of severe pain RIght RFA L3-28 November 2018-no recurrence of severe pain Left T12 L1-L2 medial branch radiofrequency neurotomy, + recurrence of moderate pain Pain Inventory Average Pain 8 Pain Right Now 8 My pain is tingling and aching  In the last 24 hours, has pain interfered with the following? General activity 7 Relation with others 7 Enjoyment of life 7 What TIME of day is your pain at its worst? all Sleep (in general) Fair  Pain is worse with: walking, bending, inactivity and standing Pain improves with: medication and injections Relief from Meds: 4  Mobility use a cane how many minutes can you walk? 20 ability to climb steps?  yes do you drive?  no  Function disabled: date disabled . I need assistance with the following:  dressing, bathing and household duties  Neuro/Psych numbness tingling trouble walking  Prior Studies Any changes since last visit?  no  Physicians involved in your care Any changes since last visit?  no   Family History  Problem Relation Age of Onset  . Hypertension Mother   . Spondylolysis Mother   . Hypercholesterolemia Mother   . Diabetes Father    Social History   Socioeconomic History  . Marital status: Married    Spouse name: Not on file  . Number of children: 4  . Years of education: 17  . Highest education level: Not on file  Occupational History  . Occupation: Biochemist, clinical  Tobacco Use  . Smoking status: Former Smoker    Packs/day: 0.25    Types: Cigarettes    Quit date: 06/14/2012    Years since quitting: 7.6  . Smokeless tobacco: Never Used  Substance and Sexual  Activity  . Alcohol use: Yes    Alcohol/week: 0.0 standard drinks    Comment: occ  . Drug use: No  . Sexual activity: Not on file  Other Topics Concern  . Not on file  Social History Narrative   Lives at home with his wife.   Right-handed.   Occasional caffeine use.   Social Determinants of Health   Financial Resource Strain:   . Difficulty of Paying Living Expenses:   Food Insecurity:   . Worried About Charity fundraiser in the Last Year:   . Arboriculturist in the Last Year:   Transportation Needs:   . Film/video editor (Medical):   Marland Kitchen Lack of Transportation (Non-Medical):   Physical Activity:   . Days of Exercise per Week:   . Minutes of Exercise per Session:   Stress:   . Feeling of Stress :   Social Connections:   . Frequency of Communication with Friends and Family:   . Frequency of Social Gatherings with Friends and Family:   . Attends Religious Services:   . Active Member of Clubs or Organizations:   . Attends Archivist Meetings:   Marland Kitchen Marital Status:    Past Surgical History:  Procedure Laterality Date  . WISDOM TOOTH EXTRACTION     Past Medical History:  Diagnosis Date  . Back pain   . Depression   . Gall stones   . Head injury   .  Neck pain   . Suicidal ideation    BP (!) 159/112   Pulse 89   Temp (!) 97.1 F (36.2 C)   Ht 6\' 1"  (1.854 m)   Wt (!) 448 lb 6.4 oz (203.4 kg)   SpO2 92%   BMI 59.16 kg/m   Opioid Risk Score:   Fall Risk Score:  `1  Depression screen PHQ 2/9  Depression screen [facility] 2/9 02/09/2020 12/07/2018 09/13/2018 05/20/2018 03/31/2017 12/04/2014  Decreased Interest 0 0 0 0 0 3  Down, Depressed, Hopeless 0 0 0 0 0 1  PHQ - 2 Score 0 0 0 0 0 4  Altered sleeping - - - - - 3  Tired, decreased energy - - - - - 2  Change in appetite - - - - - 2  Feeling bad or failure about yourself  - - - - - 0  Trouble concentrating - - - - - 1  Moving slowly or fidgety/restless - - - - - 1  Suicidal thoughts - - - - - 0  PHQ-9  Score - - - - - 13    Review of Systems     Objective:   Physical Exam Vitals and nursing note reviewed.  Constitutional:      Appearance: He is obese.  HENT:     Head: Normocephalic and atraumatic.  Eyes:     Extraocular Movements: Extraocular movements intact.     Conjunctiva/sclera: Conjunctivae normal.     Pupils: Pupils are equal, round, and reactive to light.  Neurological:     General: No focal deficit present.     Mental Status: He is alert and oriented to person, place, and time. Mental status is at baseline.  Psychiatric:        Mood and Affect: Mood normal.        Behavior: Behavior normal.   Motor strength 5/5 bilateral hip flexor knee extensor ankle dorsiflexor Negative straight leg raising Lumbar spine without tenderness palpation lumbar paraspinals There is pain with lumbar extension greater than with flexion or merrily in left side the lower lumbar area.        Assessment & Plan:  #1.  Lumbar spondylosis without myelopathy he is greater than 1 year post procedure and has had recurrence of pain as expected.  We will repeat procedure L3-L4 medial branch and L5 dorsal ramus radiofrequency neurotomy under fluoroscopic guidance. If not back pain on the left becomes more severe than with repeat T12 L1-L2 medial branch radiofrequency neurotomy 1 month later

## 2020-03-01 ENCOUNTER — Other Ambulatory Visit: Payer: Self-pay

## 2020-03-01 ENCOUNTER — Encounter: Payer: Medicaid Other | Attending: Physical Medicine & Rehabilitation | Admitting: Physical Medicine & Rehabilitation

## 2020-03-01 ENCOUNTER — Encounter: Payer: Self-pay | Admitting: Physical Medicine & Rehabilitation

## 2020-03-01 VITALS — BP 130/90 | HR 84 | Temp 97.8°F | Ht 73.0 in | Wt >= 6400 oz

## 2020-03-01 DIAGNOSIS — M47817 Spondylosis without myelopathy or radiculopathy, lumbosacral region: Secondary | ICD-10-CM | POA: Diagnosis not present

## 2020-03-01 MED ORDER — DIAZEPAM 10 MG PO TABS
10.0000 mg | ORAL_TABLET | Freq: Once | ORAL | 0 refills | Status: DC
Start: 2020-03-01 — End: 2020-03-30

## 2020-03-01 MED ORDER — GABAPENTIN 300 MG PO CAPS: 600.0000 mg | ORAL_CAPSULE | Freq: Three times a day (TID) | ORAL | 1 refills | Status: DC

## 2020-03-01 NOTE — Progress Notes (Signed)
  PROCEDURE RECORD [facility]   Name: Mark Rice DOB:[birth year] MRN: [id number]  Date:03/01/2020  Physician: Claudette Laws, MD    Nurse/CMA: Charise Carwin MA  Allergies:  Allergies  Allergen Reactions  . Penicillins Anaphylaxis    Shortness of breath  . Iohexol Itching    Patient's throat became extremely itchy after contrast administration. 50mg  Benadryl was administered. Patient's throat became extremely itchy after contrast administration. 50mg  Benadryl was administered. Patient's throat became extremely itchy after contrast administration. 50mg  Benadryl was administered.    Consent Signed: Yes.    Is patient diabetic? No.  CBG today? N/A  Pregnant: No. LMP: No LMP for male patient. (age 11-55)  Anticoagulants: no Anti-inflammatory: no Antibiotics: no  Procedure: left L3,  L4, L5,  Radio Frequency Position: Prone Start Time: 10:02 am  End Time:  48 Fluoro Time: 10:16 AM  RN/CMA , MA Klarissa Mcilvain, MA    Time 9:34 AM 10:24 AM    BP 130/90 118/76    Pulse 84 82    Respirations 16 16    O2 Sat 95 96    S/S 6 6    Pain Level 7 3     D/C home with Thessa (Wife), [name] & O X 3, D/C instructions reviewed, and sits independently.

## 2020-03-01 NOTE — Progress Notes (Signed)
Left L5 dorsal ramus., left L4 and left L3 medial branch radio frequency neurotomy under fluoroscopic guidance  Indication: Low back pain due to lumbar spondylosis which has been relieved on 2 occasions by greater than 50% by lumbar medial branch blocks at corresponding levels.  Informed consent was obtained after describing risks and benefits of the procedure with the patient, this includes bleeding, bruising, infection, paralysis and medication side effects. The patient wishes to proceed and has given written consent. The patient was placed in a prone position. The lumbar and sacral area was marked and prepped with Betadine. A 25-gauge 1-1/2 inch needle was inserted into the skin and subcutaneous tissue at 3 sites in one ML of 1% lidocaine was injected into each site. Then a 18-gauge 15 cm radio frequency needle with a 1 cm curved active tip was inserted targeting the left S1 SAP/sacral ala junction. Bone contact was made and confirmed with lateral imaging.  motor stimulation at 2 Hz confirm proper needle location followed by injection of one ML of 1% MPF lidocaine. Then the left L5 SAP/transverse process junction was targeted. Bone contact was made and confirmed with lateral imaging motor stimulation at 2 Hz confirm proper needle location followed by injection of one ML of the solution containing one ML of  1% MPF lidocaine. Then the left L4 SAP/transverse process junction was targeted. Bone contact was made and confirmed with lateral imaging. motor stimulation at 2 Hz confirm proper needle location followed by injection of one ML of the solution containing one ML of1% MPF lidocaine. Radio frequency lesion  at 80C for 90 seconds was performed. Needles were removed. Post procedure instructions and vital signs were performed. Patient tolerated procedure well. Followup appointment was given.  

## 2020-03-01 NOTE — Patient Instructions (Signed)
You had a radio frequency procedure today This was done to alleviate joint pain in your lumbar area We injected lidocaine which is a local anesthetic.  You may experience soreness at the injection sites. You may also experienced some irritation of the nerves that were heated I'm recommending ice for 30 minutes every 2 hours as needed for the next 24-48 hours In addition he will be taking gabapentin 600 mg 3 times a day

## 2020-03-29 ENCOUNTER — Other Ambulatory Visit: Payer: Self-pay

## 2020-03-29 ENCOUNTER — Encounter: Payer: Medicaid Other | Attending: Physical Medicine & Rehabilitation | Admitting: Physical Medicine & Rehabilitation

## 2020-03-29 ENCOUNTER — Encounter: Payer: Self-pay | Admitting: Physical Medicine & Rehabilitation

## 2020-03-29 VITALS — BP 114/81 | HR 71 | Temp 98.1°F | Ht 73.0 in | Wt >= 6400 oz

## 2020-03-29 DIAGNOSIS — M47817 Spondylosis without myelopathy or radiculopathy, lumbosacral region: Secondary | ICD-10-CM | POA: Insufficient documentation

## 2020-03-29 NOTE — Progress Notes (Addendum)
Left T12,L1,L2 medial branch radio frequency neurotomy under fluoroscopic guidance    Indication: Low back pain due to lumbar spondylosis which has been relieved on 2 occasions by greater than 50% by lumbar medial branch blocks at corresponding levels.  Informed consent was obtained after describing risks and benefits of the procedure with the patient, this includes bleeding, bruising, infection, paralysis and medication side effects. The patient wishes to proceed and has given written consent. The patient was placed in a prone position. The lumbar and sacral area was marked and prepped with Betadine. A 25-gauge 1-1/2 inch needle was inserted into the skin and subcutaneous tissue at 3 sites in one ML of 1% lidocaine was injected into each site. Then a 18-gauge 15 cm radio frequency needle with a 1 cm curved active tip was inserted targeting the Left L3 SAP/transverse process junction. Bone contact was made and confirmed with lateral imaging.  motor stimulation at 2 Hz confirm proper needle location followed by injection of one ML of the solution  1% MPF lidocaine. Then the Left L2 SAP/transverse process junction was targeted. Bone contact was made and confirmed with lateral imaging.  motor stimulation at 2 Hz confirm proper needle location followed by injection of one ML of the solution containing  1% MPF lidocaine. Then the Left L1 SAP/transverse process junction was targeted. Bone contact was made and confirmed with lateral imaging. Motor stim at 2 Hz followed by injection of 1ml 1% MPF lidocaine. Radio frequency lesion being at 80C for 90 seconds was performed. Needles were removed. Post procedure instructions and vital signs were performed. Patient tolerated procedure well. Followup appointment was given.Left L5 dorsal ramus., left L4 and left L3 medial branch radio frequency neurotomy under fluoroscopic guidance  Indication: Low back pain due to lumbar spondylosis which has been relieved on 2 occasions  by greater than 50% by lumbar medial branch blocks at corresponding levels.  Informed consent was obtained after describing risks and benefits of the procedure with the patient, this includes bleeding, bruising, infection, paralysis and medication side effects. The patient wishes to proceed and has given written consent. The patient was placed in a prone position. The lumbar and sacral area was marked and prepped with Betadine. A 25-gauge 1-1/2 inch needle was inserted into the skin and subcutaneous tissue at 3 sites in one ML of 1% lidocaine was injected into each site. Then a 18-gauge 15 cm radio frequency needle with a 1 cm curved active tip was inserted targeting the left S1 SAP/sacral ala junction. Bone contact was made and confirmed with lateral imaging.  motor stimulation at 2 Hz confirm proper needle location followed by injection of one ML of 2% MPF lidocaine. Then the left L5 SAP/transverse process junction was targeted. Bone contact was made and confirmed with lateral imaging motor stimulation at 2 Hz confirm proper needle location followed by injection of one ML of the solution containing one ML of  2% MPF lidocaine. Then the left L4 SAP/transverse process junction was targeted. Bone contact was made and confirmed with lateral imaging. motor stimulation at 2 Hz confirm proper needle location followed by injection of one ML of the solution containing one ML of2% MPF lidocaine. Radio frequency lesion  at 80C for 90 seconds was performed. Needles were removed. Post procedure instructions and vital signs were performed. Patient tolerated procedure well. Followup appointment was given.  

## 2020-03-29 NOTE — Patient Instructions (Signed)
You had a radio frequency procedure today This was done to alleviate joint pain in your lumbar area We injected lidocaine which is a local anesthetic.  You may experience soreness at the injection sites. You may also experienced some irritation of the nerves that were heated I'm recommending ice for 30 minutes every 2 hours as needed for the next 24-48 hours   

## 2020-03-29 NOTE — Progress Notes (Signed)
  PROCEDURE RECORD [facility]   Name: Mark Rice DOB:12/30/1980 MRN: [id number]  Date:03/29/2020  Physician: Claudette Laws, MD    Nurse/CMA: Charise Carwin MA  Allergies:  Allergies  Allergen Reactions  . Penicillins Anaphylaxis    Shortness of breath  . Iohexol Itching    Patient's throat became extremely itchy after contrast administration. 50mg  Benadryl was administered. Patient's throat became extremely itchy after contrast administration. 50mg  Benadryl was administered. Patient's throat became extremely itchy after contrast administration. 50mg  Benadryl was administered.    Consent Signed: Yes.    Is patient diabetic? No.  CBG today? N/A  Pregnant: No. LMP: No LMP for male patient. (age 41-55)  Anticoagulants: no Anti-inflammatory: no Antibiotics: no  Procedure: Left T12,L1, L2 Radio Frequency Position: Prone Start Time: 9:50AM End Time:10:07AM  Fluoro Time: 57  RN/CMA Mykia Holton MA Keidrick Murty MA    Time 09:13 AM 10:13 AM    BP 114/81 133/89    Pulse 83 83    Respirations 16 16    O2 Sat 96 83    S/S 6 6    Pain Level 8/10 2/10     D/C home with Mrs. , [name] & O X 3, D/C instructions reviewed, and sits independently.

## 2020-03-30 ENCOUNTER — Other Ambulatory Visit: Payer: Self-pay

## 2020-03-30 ENCOUNTER — Other Ambulatory Visit: Payer: Self-pay | Admitting: Physical Medicine & Rehabilitation

## 2020-03-30 MED ORDER — DIAZEPAM 10 MG PO TABS: 10.0000 mg | ORAL_TABLET | Freq: Once | ORAL | 0 refills | Status: AC

## 2020-07-27 ENCOUNTER — Other Ambulatory Visit: Payer: Self-pay

## 2020-07-27 ENCOUNTER — Encounter: Payer: Self-pay | Admitting: Physical Medicine & Rehabilitation

## 2020-07-27 ENCOUNTER — Encounter: Payer: Medicare Other | Attending: Physical Medicine & Rehabilitation | Admitting: Physical Medicine & Rehabilitation

## 2020-07-27 VITALS — BP 139/89 | HR 88 | Temp 98.7°F | Ht 73.0 in | Wt >= 6400 oz

## 2020-07-27 DIAGNOSIS — M47817 Spondylosis without myelopathy or radiculopathy, lumbosacral region: Secondary | ICD-10-CM | POA: Insufficient documentation

## 2020-07-27 MED ORDER — GABAPENTIN 300 MG PO CAPS: 600.0000 mg | ORAL_CAPSULE | Freq: Three times a day (TID) | ORAL | 1 refills | Status: AC

## 2020-07-27 NOTE — Patient Instructions (Signed)
Please call if back pain increases , we may need to repeat RF prior to next visit

## 2020-07-27 NOTE — Progress Notes (Signed)
Subjective:    Patient ID: Mark Rice, male    DOB: Sep 18, 1980, 39 y.o.   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  He has chronic low back pain.  He has mild right-sided pain but no left-sided pain.  He is no longer taking tramadol and takes occasional gabapentin for pain.  He has no sciatic pain at this time. Patient has had good relief with lumbar radiofrequency neurotomy  03/29/2020  Left T12,L1,L2 medial branch radio frequency neurotomy under fluoroscopic guidance      03/01/2020-Left L5 dorsal ramus., left L4 and left L3 medial branch radio frequency neurotomy under fluoroscopic guidance  RIght RFA L3-28 November 2018-no recurrence of severe pain- currently mild  Mod I with all self-care and mobility   Pain Inventory Average Pain 6 Pain Right Now 6 My pain is sharp and aching  In the last 24 hours, has pain interfered with the following? General activity 6 Relation with others 6 Enjoyment of life 6 What TIME of day is your pain at its worst? morning  and night Sleep (in general) Fair  Pain is worse with: bending, sitting and standing Pain improves with: medication Relief from Meds: 5  Family History  Problem Relation Age of Onset  . Hypertension Mother   . Spondylolysis Mother   . Hypercholesterolemia Mother   . Diabetes Father    Social History   Socioeconomic History  . Marital status: Married    Spouse name: Not on file  . Number of children: 4  . Years of education: 46  . Highest education level: Not on file  Occupational History  . Occupation: Company secretary  Tobacco Use  . Smoking status: Former Smoker    Packs/day: 0.25    Types: Cigarettes    Quit date: 06/14/2012    Years since quitting: 8.1  . Smokeless tobacco: Never Used  Substance and Sexual Activity  . Alcohol use: Yes    Alcohol/week: 0.0 standard drinks    Comment: occ  . Drug use: No  . Sexual activity: Not on file  Other Topics  Concern  . Not on file  Social History Narrative   Lives at home with his wife.   Right-handed.   Occasional caffeine use.   Social Determinants of Health   Financial Resource Strain:   . Difficulty of Paying Living Expenses: Not on file  Food Insecurity:   . Worried About Programme researcher, broadcasting/film/video in the Last Year: Not on file  . Ran Out of Food in the Last Year: Not on file  Transportation Needs:   . Lack of Transportation (Medical): Not on file  . Lack of Transportation (Non-Medical): Not on file  Physical Activity:   . Days of Exercise per Week: Not on file  . Minutes of Exercise per Session: Not on file  Stress:   . Feeling of Stress : Not on file  Social Connections:   . Frequency of Communication with Friends and Family: Not on file  . Frequency of Social Gatherings with Friends and Family: Not on file  . Attends Religious Services: Not on file  . Active Member of Clubs or Organizations: Not on file  . Attends Banker Meetings: Not on file  . Marital Status: Not on file   Past Surgical History:  Procedure Laterality Date  . WISDOM TOOTH EXTRACTION     Past Surgical History:  Procedure Laterality Date  . WISDOM TOOTH EXTRACTION  Past Medical History:  Diagnosis Date  . Back pain   . Depression   . Gall stones   . Head injury   . Neck pain   . Suicidal ideation    BP 139/89   Pulse 88   Temp 98.7 F (37.1 C)   Ht 6\' 1"  (1.854 m)   Wt (!) 443 lb 6.4 oz (201.1 kg)   SpO2 92%   BMI 58.50 kg/m   Opioid Risk Score:   Fall Risk Score:  `1  Depression screen PHQ 2/9  Depression screen [facility] 2/9 03/29/2020 03/01/2020 02/09/2020 12/07/2018 09/13/2018 05/20/2018 03/31/2017  Decreased Interest 0 0 0 0 0 0 0  Down, Depressed, Hopeless 0 0 0 0 0 0 0  PHQ - 2 Score 0 0 0 0 0 0 0  Altered sleeping - - - - - - -  Tired, decreased energy - - - - - - -  Change in appetite - - - - - - -  Feeling bad or failure about yourself  - - - - - - -  Trouble concentrating  - - - - - - -  Moving slowly or fidgety/restless - - - - - - -  Suicidal thoughts - - - - - - -  PHQ-9 Score - - - - - - -    Review of Systems  Musculoskeletal: Positive for back pain.  All other systems reviewed and are negative.      Objective:   Physical Exam Vitals and nursing note reviewed.  Constitutional:      Appearance: He is obese.  HENT:     Head: Normocephalic and atraumatic.  Eyes:     Extraocular Movements: Extraocular movements intact.     Conjunctiva/sclera: Conjunctivae normal.     Pupils: Pupils are equal, round, and reactive to light.  Musculoskeletal:     Comments: Patient with good lumbar flexion around 75% range Lumbar extension is 0 to 50% range Negative straight leg raising bilaterally  Neurological:     General: No focal deficit present.     Mental Status: He is alert and oriented to person, place, and time.     Sensory: Sensation is intact.     Motor: Motor function is intact.     Comments: Motor strength is 5/5 bilateral hip flexor knee extensor ankle dorsiflexor  Psychiatric:        Mood and Affect: Mood normal.           Assessment & Plan:  #1.  Lumbar spondylosis without myelopathy patient doing well following radiofrequency neurotomies as described above.  He is no longer taking tramadol.  He takes occasional gabapentin.  We will refill the gabapentin Patient will return to clinic in 6 months however if he is noticing increasing pain he can call to schedule either a follow-up or a repeat RFA.

## 2020-09-13 ENCOUNTER — Encounter: Payer: Medicare Other | Attending: Physical Medicine & Rehabilitation | Admitting: Physical Medicine & Rehabilitation

## 2020-09-13 ENCOUNTER — Other Ambulatory Visit: Payer: Self-pay

## 2020-09-13 ENCOUNTER — Encounter: Payer: Self-pay | Admitting: Physical Medicine & Rehabilitation

## 2020-09-13 VITALS — BP 140/96 | HR 92 | Temp 98.6°F | Ht 73.0 in | Wt >= 6400 oz

## 2020-09-13 DIAGNOSIS — S069X1A Unspecified intracranial injury with loss of consciousness of 30 minutes or less, initial encounter: Secondary | ICD-10-CM | POA: Insufficient documentation

## 2020-09-13 DIAGNOSIS — M47817 Spondylosis without myelopathy or radiculopathy, lumbosacral region: Secondary | ICD-10-CM | POA: Insufficient documentation

## 2020-09-13 MED ORDER — TRAMADOL HCL 50 MG PO TABS: 50.0000 mg | ORAL_TABLET | Freq: Four times a day (QID) | ORAL | 1 refills | Status: AC | PRN

## 2020-09-13 NOTE — Progress Notes (Signed)
Subjective:    Patient ID: Mark Rice, male    DOB: 1981/04/28, 40 y.o.   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  He was last seen about 1 month ago at which time he was doing quite well.  He was no longer taking his tramadol.  Both the right-sided low back pain and the left-sided low back pain were doing well.  The left mid back pain was doing okay as well. The patient denies any new trauma or falls.  No pain shooting down into the right foot but he does have some radiating pain into his right thigh.  03/29/2020  Left T12,L1,L2 medial branch radio frequency neurotomy under fluoroscopic guidance      03/01/2020-Left L5 dorsal ramus., left L4 and left L3 medial branch radio frequency neurotomy under fluoroscopic guidance  RIght RFA L3-28 November 2018- Pain Inventory Average Pain 9 Pain Right Now 9 My pain is stabbing and aching  In the last 24 hours, has pain interfered with the following? General activity 9 Relation with others 9 Enjoyment of life 9 What TIME of day is your pain at its worst? varies Sleep (in general) Good  Pain is worse with: walking, bending, sitting, inactivity and standing Pain improves with: medication Relief from Meds: 4  Family History  Problem Relation Age of Onset  . Hypertension Mother   . Spondylolysis Mother   . Hypercholesterolemia Mother   . Diabetes Father    Social History   Socioeconomic History  . Marital status: Married    Spouse name: Not on file  . Number of children: 4  . Years of education: 51  . Highest education level: Not on file  Occupational History  . Occupation: Company secretary  Tobacco Use  . Smoking status: Former Smoker    Packs/day: 0.25    Types: Cigarettes    Quit date: 06/14/2012    Years since quitting: 8.2  . Smokeless tobacco: Never Used  Substance and Sexual Activity  . Alcohol use: Yes     Alcohol/week: 0.0 standard drinks    Comment: occ  . Drug use: No  . Sexual activity: Not on file  Other Topics Concern  . Not on file  Social History Narrative   Lives at home with his wife.   Right-handed.   Occasional caffeine use.   Social Determinants of Health   Financial Resource Strain: Not on file  Food Insecurity: Not on file  Transportation Needs: Not on file  Physical Activity: Not on file  Stress: Not on file  Social Connections: Not on file   Past Surgical History:  Procedure Laterality Date  . WISDOM TOOTH EXTRACTION     Past Surgical History:  Procedure Laterality Date  . WISDOM TOOTH EXTRACTION     Past Medical History:  Diagnosis Date  . Back pain   . Depression   . Gall stones   . Head injury   . Neck pain   . Suicidal ideation    BP (!) 140/96   Pulse 92   Temp 98.6 F (37 C)   Ht 6\' 1"  (1.854 m)   Wt (!) 446 lb 6.4 oz (202.5 kg)   SpO2 90%   BMI 58.90 kg/m   Opioid Risk Score:   Fall Risk Score:  `1  Depression screen [facility] 2/9  Depression screen [facility] 2/9 03/29/2020 03/01/2020 02/09/2020 12/07/2018 09/13/2018 05/20/2018 03/31/2017  Decreased Interest  0 0 0 0 0 0 0  Down, Depressed, Hopeless 0 0 0 0 0 0 0  PHQ - 2 Score 0 0 0 0 0 0 0  Altered sleeping - - - - - - -  Tired, decreased energy - - - - - - -  Change in appetite - - - - - - -  Feeling bad or failure about yourself  - - - - - - -  Trouble concentrating - - - - - - -  Moving slowly or fidgety/restless - - - - - - -  Suicidal thoughts - - - - - - -  PHQ-9 Score - - - - - - -    Review of Systems  Musculoskeletal: Positive for back pain.       Leg pain  All other systems reviewed and are negative.      Objective:   Physical Exam Vitals and nursing note reviewed.  Constitutional:      Appearance: He is obese.  HENT:     Head: Normocephalic and atraumatic.  Eyes:     Extraocular Movements: Extraocular movements intact.     Conjunctiva/sclera: Conjunctivae normal.      Pupils: Pupils are equal, round, and reactive to light.  Musculoskeletal:     Comments: Tenderness palpation right lower lumbar paraspinals no tenderness in the left lower paraspinals or in the left upper paraspinals in the lumbar area.  Neurological:     Mental Status: He is alert and oriented to person, place, and time.     Comments: Motor strength is normal bilateral lower extremities Gait is without evidence of toe drag or knee instability Negative straight leg raise  Psychiatric:        Mood and Affect: Mood normal.        Behavior: Behavior normal.           Assessment & Plan:  #1.  Right lumbar spondylosis, lower lumbar area.  This area was previously treated with radiofrequency neurotomy to the L3 and L4 medial branches as well as the L5 dorsal ramus.  Prior radiofrequency procedure was in April 2020.  It is starting to wear off.  Recommend repeat radiofrequency to the same nerves.  Patient is in agreement In the interim time have written for tramadol 50 mg 4 times daily as needed #30 with 1 refill

## 2020-09-13 NOTE — Patient Instructions (Signed)
Repat Right RFA next visit Tramadol cna be used until that time

## 2020-10-05 ENCOUNTER — Other Ambulatory Visit: Payer: Self-pay

## 2020-10-05 MED ORDER — DIAZEPAM 10 MG PO TABS: 10.0000 mg | ORAL_TABLET | Freq: Once | ORAL | 0 refills | Status: AC

## 2020-10-12 ENCOUNTER — Other Ambulatory Visit: Payer: Self-pay

## 2020-10-12 ENCOUNTER — Encounter: Payer: Self-pay | Admitting: Physical Medicine & Rehabilitation

## 2020-10-12 ENCOUNTER — Encounter: Payer: Medicare Other | Attending: Physical Medicine & Rehabilitation | Admitting: Physical Medicine & Rehabilitation

## 2020-10-12 VITALS — BP 141/94 | HR 104 | Temp 98.0°F | Ht 73.0 in | Wt >= 6400 oz

## 2020-10-12 DIAGNOSIS — M47817 Spondylosis without myelopathy or radiculopathy, lumbosacral region: Secondary | ICD-10-CM | POA: Insufficient documentation

## 2020-10-12 NOTE — Progress Notes (Signed)
  PROCEDURE RECORD [place] [facility]   Name: Mark Rice DOB:09-17-1980 MRN: [id number]  Date:10/12/2020  Physician: Claudette Laws, MD    Nurse/CMA: Charise Carwin MA  Allergies:  Allergies  Allergen Reactions  . Penicillins Anaphylaxis    Shortness of breath  . Iohexol Itching    Patient's throat became extremely itchy after contrast administration. 50mg  Benadryl was administered. Patient's throat became extremely itchy after contrast administration. 50mg  Benadryl was administered. Patient's throat became extremely itchy after contrast administration. 50mg  Benadryl was administered.  . Contrast Media [Iodinated Diagnostic Agents]     Consent Signed: Yes.    Is patient diabetic? No.  CBG today? n/a  Pregnant: No. LMP: No LMP for male patient. (age 43-55)  Anticoagulants: no Anti-inflammatory: no Antibiotics: no  Procedure: RIght L3-4-5 RFA. Position: Prone Start Time 12:05PM End Time: 12:25PM   Fluoro Time:37  RN/CMA Idaliz Tinkle MA Shyasia Funches MA    Time 10:40 AM 12:31PM    BP 141/94 131/94    Pulse 101 91    Respirations 16 16    O2 Sat 95 95    S/S 6 6    Pain Level 9/10 2/10     D/C home with Wife,  [name] & O X 3, D/C instructions reviewed, and sits independently.

## 2020-10-12 NOTE — Patient Instructions (Signed)
You had a radio frequency procedure today This was done to alleviate joint pain in your lumbar area We injected lidocaine which is a local anesthetic.  You may experience soreness at the injection sites. You may also experienced some irritation of the nerves that were heated I'm recommending ice for 30 minutes every 2 hours as needed for the next 24-48 hours   

## 2020-10-12 NOTE — Progress Notes (Signed)
RightL5 dorsal ramus., Right L4 and Right L3 medial branch radio frequency neurotomy under fluoroscopic guidance   Indication: Low back pain due to lumbar spondylosis which has been relieved on 2 occasions by greater than 50% by lumbar medial branch blocks at corresponding levels.  Informed consent was obtained after describing risks and benefits of the procedure with the patient, this includes bleeding, bruising, infection, paralysis and medication side effects. The patient wishes to proceed and has given written consent. The patient was placed in a prone position. The lumbar and sacral area was marked and prepped with Betadine. A 25-gauge 1-1/2 inch needle was inserted into the skin and subcutaneous tissue at 3 sites in one ML of 1% lidocaine was injected into each site. Then a 20-gauge 15 cm radio frequency needle with a 1 cm curved active tip was inserted targeting the Right S1 SAP/sacral ala junction. Bone contact was made and confirmed with lateral imaging.  motor stimulation at 2 Hz confirm proper needle location followed by injection of 32ml 2% MPF lidocaine. Then the Right L5 SAP/transverse process junction was targeted. Bone contact was made and confirmed with lateral imaging.  motor stimulation at 2 Hz confirm proper needle location followed by injection of 51ml 2% MPF lidocaine. Then the Right L4 SAP/transverse process junction was targeted. Bone contact was made and confirmed with lateral imaging. motor stimulation at 2 Hz confirm proper needle location followed by injection of 42ml 2% MPF lidocaine. Radio frequency lesion being at [facility] for 90 seconds was performed. Needles were removed. Post procedure instructions and vital signs were performed. Patient tolerated procedure well. Followup appointment was given.

## 2020-11-09 ENCOUNTER — Encounter: Payer: Medicare Other | Attending: Physical Medicine & Rehabilitation | Admitting: Physical Medicine & Rehabilitation

## 2020-11-09 ENCOUNTER — Encounter: Payer: Self-pay | Admitting: Physical Medicine & Rehabilitation

## 2020-11-09 ENCOUNTER — Other Ambulatory Visit: Payer: Self-pay

## 2020-11-09 VITALS — BP 141/89 | HR 90 | Temp 97.8°F | Ht 73.0 in | Wt >= 6400 oz

## 2020-11-09 DIAGNOSIS — M47817 Spondylosis without myelopathy or radiculopathy, lumbosacral region: Secondary | ICD-10-CM

## 2020-11-09 NOTE — Progress Notes (Signed)
Left L5 dorsal ramus., left L4 and left L3 medial branch radio frequency neurotomy under fluoroscopic guidance  Indication: Low back pain due to lumbar spondylosis which has been relieved on 2 occasions by greater than 50% by lumbar medial branch blocks at corresponding levels.  Informed consent was obtained after describing risks and benefits of the procedure with the patient, this includes bleeding, bruising, infection, paralysis and medication side effects. The patient wishes to proceed and has given written consent. The patient was placed in a prone position. The lumbar and sacral area was marked and prepped with Betadine. A 25-gauge 1-1/2 inch needle was inserted into the skin and subcutaneous tissue at 3 sites in one ML of 1% lidocaine was injected into each site. Then a 18-gauge 15 cm radio frequency needle with a 1 cm curved active tip was inserted targeting the left S1 SAP/sacral ala junction. Bone contact was made and confirmed with lateral imaging.  motor stimulation at 2 Hz confirm proper needle location followed by injection of one ML of 1% MPF lidocaine. Then the left L5 SAP/transverse process junction was targeted. Bone contact was made and confirmed with lateral imaging motor stimulation at 2 Hz confirm proper needle location followed by injection of one ML of the solution containing one ML of  1% MPF lidocaine. Then the left L4 SAP/transverse process junction was targeted. Bone contact was made and confirmed with lateral imaging. motor stimulation at 2 Hz confirm proper needle location followed by injection of one ML of the solution containing one ML of1% MPF lidocaine. Radio frequency lesion  at 80C for 90 seconds was performed. Needles were removed. Post procedure instructions and vital signs were performed. Patient tolerated procedure well. Followup appointment was given.  

## 2020-11-09 NOTE — Progress Notes (Signed)
  PROCEDURE RECORD [place] [facility]   Name: Mark Rice DOB:[birth year] MRN: [id number]  Date:11/09/2020  Physician: Claudette Laws, MD    Nurse/CMA: Charise Carwin MA  Allergies:  Allergies  Allergen Reactions  . Penicillins Anaphylaxis    Shortness of breath  . Iohexol Itching    Patient's throat became extremely itchy after contrast administration. 50mg  Benadryl was administered. Patient's throat became extremely itchy after contrast administration. 50mg  Benadryl was administered. Patient's throat became extremely itchy after contrast administration. 50mg  Benadryl was administered.  . Contrast Media [Iodinated Diagnostic Agents]     Consent Signed: Yes.    Is patient diabetic? No.  CBG today? N/A  Pregnant: No. LMP: No LMP for male patient. (age 57-55)  Anticoagulants: no Anti-inflammatory: no Antibiotics: no  Procedure:  L L3-4-5 Radiofeequency Position: Prone Start Time: 11:30 am End Time: 11:50 am   Fluoro Time: 34  RN/CMA Yon Schiffman MA Rayden Scheper MA    Time 11:06 11:51 AM    BP 141/89 153/93    Pulse 90 86    Respirations 18 16    O2 Sat 96 97    S/S 6 6    Pain Level 6/10 2/10     D/C home with Wife, [name] & O X 3, D/C instructions reviewed, and sits independently.

## 2020-11-09 NOTE — Patient Instructions (Signed)
You had a radio frequency procedure today This was done to alleviate joint pain in your lumbar area We injected lidocaine which is a local anesthetic.  You may experience soreness at the injection sites. You may also experienced some irritation of the nerves that were heated I'm recommending ice for 30 minutes every 2 hours as needed for the next 24-48 hours   

## 2020-11-16 ENCOUNTER — Ambulatory Visit: Payer: Medicare Other | Admitting: Family Medicine

## 2020-11-30 ENCOUNTER — Other Ambulatory Visit: Payer: Self-pay | Admitting: Physical Medicine & Rehabilitation

## 2020-11-30 ENCOUNTER — Telehealth: Payer: Self-pay

## 2020-11-30 MED ORDER — TRAMADOL HCL 50 MG PO TABS: 50.0000 mg | ORAL_TABLET | Freq: Every day | ORAL | 1 refills | Status: DC | PRN

## 2020-11-30 NOTE — Telephone Encounter (Signed)
Please call in tramadol 50 mg 1 p.o. daily as needed #30 with 1 refill

## 2020-11-30 NOTE — Telephone Encounter (Signed)
Requesting refill for Tramadol last filled #30 on 10/05/20 and Gabapentin. Next appt 02/07/21

## 2020-11-30 NOTE — Telephone Encounter (Signed)
Called to pharmacy and Kenechukwu notified.

## 2021-01-25 ENCOUNTER — Ambulatory Visit: Payer: Medicare Other | Admitting: Physical Medicine & Rehabilitation

## 2021-02-07 ENCOUNTER — Ambulatory Visit: Payer: Medicare Other | Admitting: Physical Medicine & Rehabilitation

## 2021-02-12 ENCOUNTER — Other Ambulatory Visit: Payer: Self-pay

## 2021-02-12 ENCOUNTER — Encounter: Payer: Medicare Other | Attending: Physical Medicine & Rehabilitation | Admitting: Physical Medicine & Rehabilitation

## 2021-02-12 ENCOUNTER — Encounter: Payer: Self-pay | Admitting: Physical Medicine & Rehabilitation

## 2021-02-12 VITALS — BP 117/89 | HR 101 | Temp 98.9°F | Ht 73.0 in | Wt 367.8 lb

## 2021-02-12 DIAGNOSIS — Z79891 Long term (current) use of opiate analgesic: Secondary | ICD-10-CM | POA: Diagnosis not present

## 2021-02-12 DIAGNOSIS — Z5181 Encounter for therapeutic drug level monitoring: Secondary | ICD-10-CM | POA: Diagnosis present

## 2021-02-12 DIAGNOSIS — G894 Chronic pain syndrome: Secondary | ICD-10-CM | POA: Insufficient documentation

## 2021-02-12 MED ORDER — TRAMADOL HCL 50 MG PO TABS: 50.0000 mg | ORAL_TABLET | Freq: Every day | ORAL | 1 refills | Status: DC | PRN

## 2021-02-12 NOTE — Progress Notes (Signed)
Subjective:    Patient ID: Mark Rice, male    DOB: [birth year], 40 y.o.   MRN: [id number] Author: Erick Colace, MD Author Type: Physician Filed: 11/09/2020 12:18 PM  Note Status: Signed Cosign: Cosign Not Required Encounter Date: 11/09/2020  Editor: Erick Colace, MD (Physician)                Left L5 dorsal ramus., left L4 and left L3 medial branch radio frequency neurotomy under fluoroscopic guidance      Author: Erick Colace, MD Author Type: Physician Filed: 10/12/2020 12:42 PM  Note Status: Signed Cosign: Cosign Not Required Encounter Date: 10/12/2020  Editor: Erick Colace, MD (Physician)                RightL5 dorsal ramus., Right L4 and Right L3 medial branch radio frequency neurotomy under fluoroscopic guidance     R HPI 40 year old male with lumbar spondylosis without myelopathy.  History of Morbid obesity but has been losing weight steadily over the last 6 months.  He has lost 80 pounds thus far.  His goal is to lose another 200 pounds. Right side lumbar area sometimes bothers him in am and evening but pain is fairly mild at this point. No pain with walking  Lost 60lb, walking 5 mi per day 5 d per week, has reduced his meat consumption.  Pain Inventory Average Pain 6 Pain Right Now 6 My pain is intermittent, sharp, stabbing, and tingling  In the last 24 hours, has pain interfered with the following? General activity 5 Relation with others 5 Enjoyment of life 5 What TIME of day is your pain at its worst? morning  and night Sleep (in general) Fair  Pain is worse with: walking, bending, standing, and some activites Pain improves with: medication Relief from Meds: 7  Family History  Problem Relation Age of Onset   Hypertension Mother    Spondylolysis Mother    Hypercholesterolemia Mother    Diabetes Father    Social History   Socioeconomic History   Marital status: Married    Spouse name: Not on file   Number of children: 4    Years of education: 14   Highest education level: Not on file  Occupational History   Occupation: Company secretary  Tobacco Use   Smoking status: Former    Packs/day: 0.25    Pack years: 0.00    Types: Cigarettes    Quit date: 06/14/2012    Years since quitting: 8.6   Smokeless tobacco: Never  Vaping Use   Vaping Use: Never used  Substance and Sexual Activity   Alcohol use: Not Currently    Alcohol/week: 0.0 standard drinks    Comment: occ   Drug use: No   Sexual activity: Not on file  Other Topics Concern   Not on file  Social History Narrative   Lives at home with his wife.   Right-handed.   Occasional caffeine use.   Social Determinants of Health   Financial Resource Strain: Not on file  Food Insecurity: Not on file  Transportation Needs: Not on file  Physical Activity: Not on file  Stress: Not on file  Social Connections: Not on file   Past Surgical History:  Procedure Laterality Date   WISDOM TOOTH EXTRACTION     Past Surgical History:  Procedure Laterality Date   WISDOM TOOTH EXTRACTION     Past Medical History:  Diagnosis Date   Back pain    Depression  Gall stones    Head injury    Neck pain    Suicidal ideation    BP 117/89 (BP Location: Right Arm, Patient Position: Sitting, Cuff Size: Large)   Pulse (!) 101   Temp 98.9 F (37.2 C) (Oral)   Ht 6\' 1"  (1.854 m)   Wt (!) 367 lb 12.8 oz (166.8 kg)   SpO2 95%   BMI 48.53 kg/m   Opioid Risk Score:   Fall Risk Score:  `1  Depression screen PHQ 2/9  Depression screen [facility] 2/9 11/09/2020 10/12/2020 03/29/2020 03/01/2020 02/09/2020 12/07/2018 09/13/2018  Decreased Interest 0 0 0 0 0 0 0  Down, Depressed, Hopeless 0 0 0 0 0 0 0  PHQ - 2 Score 0 0 0 0 0 0 0  Altered sleeping - - - - - - -  Tired, decreased energy - - - - - - -  Change in appetite - - - - - - -  Feeling bad or failure about yourself  - - - - - - -  Trouble concentrating - - - - - - -  Moving slowly or fidgety/restless - - - - - - -   Suicidal thoughts - - - - - - -  PHQ-9 Score - - - - - - -      Review of Systems  Musculoskeletal:  Positive for back pain.       Pelvic pain  All other systems reviewed and are negative.     Objective:   Physical Exam Vitals and nursing note reviewed.  Constitutional:      Appearance: He is obese.  HENT:     Head: Normocephalic and atraumatic.  Eyes:     Extraocular Movements: Extraocular movements intact.     Conjunctiva/sclera: Conjunctivae normal.     Pupils: Pupils are equal, round, and reactive to light.  Musculoskeletal:     Comments: Lumbar spine range of motion with normal flexion extension lateral bending and rotation  Skin:    General: Skin is warm and dry.  Neurological:     Mental Status: He is alert and oriented to person, place, and time.     Cranial Nerves: No dysarthria.     Motor: No tremor.     Coordination: Coordination is intact. Coordination normal.     Gait: Gait is intact.     Comments: Motor strength is 5/5 bilateral hip flexor knee extensor ankle dorsiflexor Negative straight leg raising bilaterally.  Psychiatric:        Mood and Affect: Mood normal.        Behavior: Behavior normal.          Assessment & Plan:   1.  Lumbar spondylosis without myelopathy overall doing well with pain.  Patient feels his weight loss has also helped with his pain.  He continues to take tramadol but on a fairly infrequent basis.  He took 1 tablet this morning but does not take it every day.  We will continue tramadol 50 mg daily as needed UDS today as part of screening for chronic opioids.  He has not exhibited any aberrant behavior. We will see him back in 6 months however if his back pain starts to flareup prior to that time he is to call so we can schedule repeat radiofrequency neurotomy.

## 2021-02-12 NOTE — Patient Instructions (Signed)
Please call if back pain is worsening

## 2021-02-19 LAB — TOXASSURE SELECT,+ANTIDEPR,UR

## 2021-02-20 ENCOUNTER — Telehealth: Payer: Self-pay

## 2021-02-20 ENCOUNTER — Ambulatory Visit: Payer: Medicare Other | Admitting: Family Medicine

## 2021-02-20 NOTE — Telephone Encounter (Signed)
Patient lvm to cancel appointment. Did not say why. Canceled appointment.

## 2021-02-21 ENCOUNTER — Telehealth: Payer: Self-pay | Admitting: *Deleted

## 2021-02-21 NOTE — Telephone Encounter (Signed)
Urine drug screen for this encounter is consistent for prescribed medication 

## 2021-04-05 ENCOUNTER — Telehealth: Payer: Self-pay | Admitting: *Deleted

## 2021-04-05 NOTE — Telephone Encounter (Signed)
Mark Rice called to request an order for [facility] to get a HH CNA to assist with his personal care needs while his kids are in school who usually assist him.  Per Dr Wynn Banker, they will need to contact his brain injury MD at [facility]. He is treating for back arthritis and that would not qualify.  I have let his wife know.

## 2021-04-09 ENCOUNTER — Other Ambulatory Visit: Payer: Self-pay

## 2021-04-09 ENCOUNTER — Encounter: Payer: Self-pay | Admitting: Physical Medicine & Rehabilitation

## 2021-04-09 ENCOUNTER — Encounter: Payer: Medicare Other | Attending: Physical Medicine & Rehabilitation | Admitting: Physical Medicine & Rehabilitation

## 2021-04-09 VITALS — BP 127/88 | HR 99 | Temp 98.1°F | Ht 73.0 in | Wt 351.4 lb

## 2021-04-09 DIAGNOSIS — M47817 Spondylosis without myelopathy or radiculopathy, lumbosacral region: Secondary | ICD-10-CM | POA: Diagnosis not present

## 2021-04-09 NOTE — Progress Notes (Signed)
Subjective:    Patient ID: Mark Rice, male    DOB: [birth year], 40 y.o.   MRN: [id number]  HPI 40 year old male with history of lumbar spondylosis as well as morbid obesity is here for follow-up of right-sided greater than left-sided low back pain. Right L3-4-5 RF starting to wear off, last right-sided RF was performed in February 2022. He has had no new accidents or injuries He is continue to lose weight through exercise and diet.  Has lost an additional 16 pounds over the last 2 months. Continues to walk about 5 miles per day Patient denies knee, hip, foot or ankle pain Pain Inventory Average Pain 9 Pain Right Now 9 My pain is constant, stabbing, and aching  In the last 24 hours, has pain interfered with the following? General activity 8 Relation with others 8 Enjoyment of life 8 What TIME of day is your pain at its worst? morning  and night Sleep (in general) Poor  Pain is worse with: walking, bending, sitting, and standing Pain improves with: rest and medication Relief from Meds: 4  Family History  Problem Relation Age of Onset   Hypertension Mother    Spondylolysis Mother    Hypercholesterolemia Mother    Diabetes Father    Social History   Socioeconomic History   Marital status: Married    Spouse name: Not on file   Number of children: 4   Years of education: 14   Highest education level: Not on file  Occupational History   Occupation: Company secretary  Tobacco Use   Smoking status: Former    Packs/day: 0.25    Types: Cigarettes    Quit date: 06/14/2012    Years since quitting: 8.8   Smokeless tobacco: Never  Vaping Use   Vaping Use: Never used  Substance and Sexual Activity   Alcohol use: Not Currently    Alcohol/week: 0.0 standard drinks    Comment: occ   Drug use: No   Sexual activity: Not on file  Other Topics Concern   Not on file  Social History Narrative   Lives at home with his wife.   Right-handed.   Occasional caffeine use.    Social Determinants of Health   Financial Resource Strain: Not on file  Food Insecurity: Not on file  Transportation Needs: Not on file  Physical Activity: Not on file  Stress: Not on file  Social Connections: Not on file   Past Surgical History:  Procedure Laterality Date   WISDOM TOOTH EXTRACTION     Past Surgical History:  Procedure Laterality Date   WISDOM TOOTH EXTRACTION     Past Medical History:  Diagnosis Date   Back pain    Depression    Gall stones    Head injury    Neck pain    Suicidal ideation    Ht 6\' 1"  (1.854 m)   BMI 48.53 kg/m   Opioid Risk Score:   Fall Risk Score:  `1  Depression screen PHQ 2/9  Depression screen [facility] 2/9 04/09/2021 11/09/2020 10/12/2020 03/29/2020 03/01/2020 02/09/2020 12/07/2018  Decreased Interest 0 0 0 0 0 0 0  Down, Depressed, Hopeless 0 0 0 0 0 0 0  PHQ - 2 Score 0 0 0 0 0 0 0  Altered sleeping - - - - - - -  Tired, decreased energy - - - - - - -  Change in appetite - - - - - - -  Feeling bad or failure about yourself  - - - - - - -  Trouble concentrating - - - - - - -  Moving slowly or fidgety/restless - - - - - - -  Suicidal thoughts - - - - - - -  PHQ-9 Score - - - - - - -    Review of Systems  Musculoskeletal:        Right side & right lower back pain  All other systems reviewed and are negative.     Objective:   Physical Exam Vitals and nursing note reviewed.  Constitutional:      Appearance: He is obese.  HENT:     Head: Normocephalic and atraumatic.  Eyes:     Extraocular Movements: Extraocular movements intact.     Conjunctiva/sclera: Conjunctivae normal.     Pupils: Pupils are equal, round, and reactive to light.  Skin:    General: Skin is warm and dry.  Neurological:     Mental Status: He is alert and oriented to person, place, and time.     Cranial Nerves: No dysarthria.     Sensory: No sensory deficit.     Motor: No weakness.     Coordination: Coordination is intact.     Gait: Gait is intact.   Psychiatric:        Mood and Affect: Mood normal.        Behavior: Behavior normal.   Lumbar spine has tenderness palpation along the L4 L5-S1 region. Pain is worse with extension than with flexion.  He has limited extension as well as lateral bending.      Assessment & Plan:  1.  Lumbar spondylosis without myelopathy.  Some exacerbation of right-sided low back pain which corresponds with his usual duration of effect of around 6 months.  We will schedule for repeat right L3-L4 medial branch L5 dorsal ramus radiofrequency neurotomy under fluoroscopic guidance.

## 2021-05-14 ENCOUNTER — Ambulatory Visit: Payer: Medicare Other | Admitting: Medical-Surgical

## 2021-05-16 ENCOUNTER — Ambulatory Visit: Payer: Medicare Other | Admitting: Physical Medicine & Rehabilitation

## 2021-06-13 ENCOUNTER — Encounter: Payer: Self-pay | Admitting: Physical Medicine & Rehabilitation

## 2021-06-13 ENCOUNTER — Encounter: Payer: Medicare Other | Attending: Physical Medicine & Rehabilitation | Admitting: Physical Medicine & Rehabilitation

## 2021-06-13 ENCOUNTER — Other Ambulatory Visit: Payer: Self-pay

## 2021-06-13 VITALS — BP 143/88 | HR 96 | Ht 73.0 in | Wt 348.0 lb

## 2021-06-13 DIAGNOSIS — M47817 Spondylosis without myelopathy or radiculopathy, lumbosacral region: Secondary | ICD-10-CM | POA: Diagnosis not present

## 2021-06-13 NOTE — Progress Notes (Signed)
  PROCEDURE RECORD [facility]   Name: Mark Rice DOB:[birth year] MRN: [id number]  Date:06/13/2021  Physician: Claudette Laws, MD    Nurse/CMA: Nedra Hai, CMA  Allergies:  Allergies  Allergen Reactions   Penicillins Anaphylaxis    Shortness of breath   Iohexol Itching    Patient's throat became extremely itchy after contrast administration. 50mg  Benadryl was administered. Patient's throat became extremely itchy after contrast administration. 50mg  Benadryl was administered. Patient's throat became extremely itchy after contrast administration. 50mg  Benadryl was administered.   Contrast Media [Iodinated Diagnostic Agents]     Consent Signed: Yes.    Is patient diabetic? Yes.    CBG today? 90  Pregnant: No. LMP: No LMP for male patient. (age 63-55)  Anticoagulants: no Anti-inflammatory: no Antibiotics: no  Procedure: Right L3, 4, 5 Radiofrequency  Position: Prone Start Time: 2:00 pm  End Time: 2:25 pm  Fluoro Time: 44  RN/CMA , CMA    Time 1:35 PM 2:30 pm    BP 148/88 131/89    Pulse 96 94    Respirations 16 16    O2 Sat 96 97    S/S 6 6    Pain Level 8/10 2/10     D/C home with Wife, [name] & O X 3, D/C instructions reviewed, and sits independently.         Subjective:    Patient ID: , male    DOB: 07-19-1981, 40 y.o.   MRN: Mark Rice  HPI    Review of Systems     Objective:   Physical Exam        Assessment & Plan:

## 2021-06-13 NOTE — Progress Notes (Signed)
RightL5 dorsal ramus., Right L4 and Right L3 medial branch radio frequency neurotomy under fluoroscopic guidance   Indication: Low back pain due to lumbar spondylosis which has been relieved on 2 occasions by greater than 50% by lumbar medial branch blocks at corresponding levels.  Informed consent was obtained after describing risks and benefits of the procedure with the patient, this includes bleeding, bruising, infection, paralysis and medication side effects. The patient wishes to proceed and has given written consent. The patient was placed in a prone position. The lumbar and sacral area was marked and prepped with Betadine. A 25-gauge 1-1/2 inch needle was inserted into the skin and subcutaneous tissue at 3 sites in one ML of 1% lidocaine was injected into each site. Then a 20-gauge 15 cm radio frequency needle with a 1 cm curved active tip was inserted targeting the Right S1 SAP/sacral ala junction. Bone contact was made and confirmed with lateral imaging.  motor stimulation at 2 Hz confirm proper needle location followed by injection of 1ml 2% MPF lidocaine. Then the Right L5 SAP/transverse process junction was targeted. Bone contact was made and confirmed with lateral imaging.  motor stimulation at 2 Hz confirm proper needle location followed by injection of 1ml 2% MPF lidocaine. Then the Right L4 SAP/transverse process junction was targeted. Bone contact was made and confirmed with lateral imaging. motor stimulation at 2 Hz confirm proper needle location followed by injection of 1ml 2% MPF lidocaine. Radio frequency lesion being at 80C for 90 seconds was performed. Needles were removed. Post procedure instructions and vital signs were performed. Patient tolerated procedure well. Followup appointment was given.   

## 2021-06-13 NOTE — Patient Instructions (Signed)
You had a radio frequency procedure today This was done to alleviate joint pain in your lumbar area We injected lidocaine which is a local anesthetic.  You may experience soreness at the injection sites. You may also experienced some irritation of the nerves that were heated I'm recommending ice for 30 minutes every 2 hours as needed for the next 24-48 hours   

## 2021-08-13 ENCOUNTER — Ambulatory Visit: Payer: Medicare Other | Admitting: Physical Medicine & Rehabilitation

## 2021-10-29 ENCOUNTER — Other Ambulatory Visit: Payer: Self-pay | Admitting: Physical Medicine & Rehabilitation

## 2021-12-12 ENCOUNTER — Encounter: Payer: Medicare Other | Attending: Physical Medicine & Rehabilitation | Admitting: Physical Medicine & Rehabilitation

## 2021-12-12 ENCOUNTER — Encounter: Payer: Self-pay | Admitting: Physical Medicine & Rehabilitation

## 2021-12-12 VITALS — BP 129/89 | HR 90 | Ht 73.0 in | Wt 362.0 lb

## 2021-12-12 DIAGNOSIS — Z5181 Encounter for therapeutic drug level monitoring: Secondary | ICD-10-CM | POA: Insufficient documentation

## 2021-12-12 DIAGNOSIS — G894 Chronic pain syndrome: Secondary | ICD-10-CM | POA: Insufficient documentation

## 2021-12-12 DIAGNOSIS — Z79891 Long term (current) use of opiate analgesic: Secondary | ICD-10-CM | POA: Insufficient documentation

## 2021-12-12 NOTE — Progress Notes (Signed)
? ?Subjective:  ? ? Patient ID: Mark Rice, male    DOB: Jul 20, 1981, 41 y.o.   MRN: [id number] ?tus: Signed Cosign: Cosign Not Required Encounter Date: 06/13/2021  ?Editor: Erick Colace, MD (Physician)      ?       ? ?  ?RightL5 dorsal ramus., Right L4 and Right L3 medial branch radio frequency neurotomy under fluoroscopic guidance   ?  ? ?tus: Signed Cosign: Cosign Not Required Encounter Date: 06/13/2021  ?Editor: Erick Colace, MD (Physician)      ?       ? ?  ?RightL5 dorsal ramus., Right L4 and Right L3 medial branch radio frequency neurotomy under fluoroscopic guidance   ?  ? ? ?Author: Erick Colace, MD Author Type: Physician Filed: 11/09/2020 12:18 PM  ?Note Status: Signed Cosign: Cosign Not Required Encounter Date: 11/09/2020  ?Editor: Erick Colace, MD (Physician)      ?       ? ?  ?Left L5 dorsal ramus., left L4 and left L3 medial branch radio frequency neurotomy under fluoroscopic guidance  ?  ? ?HPI ?41 year old male seen at this clinic for chronic lumbar spondylosis without myelopathy.  He has a history of morbid obesity.  He also has a history of traumatic brain injury which is managed at [facility] ?He has had good relief following bilateral L3-L4-L5 medial branch radiofrequency neurotomy but feels like the relief is wearing off.  He has started exercising more doing both aerobic exercise with stationary bicycling as well as weight lifting including kettle bell swing exercises. ?The patient feels left side is more painful than the right side. ?Pain Inventory ?Average Pain 7 ?Pain Right Now 7 ?My pain is sharp, stabbing, and aching ? ?In the last 24 hours, has pain interfered with the following? ?General activity 7 ?Relation with others 7 ?Enjoyment of life 7 ?What TIME of day is your pain at its worst? morning  and night ?Sleep (in general) Fair ? ?Pain is worse with: bending, standing, and some activites ?Pain improves with: medication and  injections ?Relief from Meds: 5 ? ?Family History  ?Problem Relation Age of Onset  ? Hypertension Mother   ? Spondylolysis Mother   ? Hypercholesterolemia Mother   ? Diabetes Father   ? ?Social History  ? ?Socioeconomic History  ? Marital status: Married  ?  Spouse name: Not on file  ? Number of children: 4  ? Years of education: 38  ? Highest education level: Not on file  ?Occupational History  ? Occupation: Company secretary  ?Tobacco Use  ? Smoking status: Former  ?  Packs/day: 0.25  ?  Types: Cigarettes  ?  Quit date: 06/14/2012  ?  Years since quitting: 9.5  ? Smokeless tobacco: Never  ?Vaping Use  ? Vaping Use: Never used  ?Substance and Sexual Activity  ? Alcohol use: Not Currently  ?  Alcohol/week: 0.0 standard drinks  ?  Comment: occ  ? Drug use: No  ? Sexual activity: Not on file  ?Other Topics Concern  ? Not on file  ?Social History Narrative  ? Lives at home with his wife.  ? Right-handed.  ? Occasional caffeine use.  ? ?Social Determinants of Health  ? ?Financial Resource Strain: Not on file  ?Food Insecurity: Not on file  ?Transportation Needs: Not on file  ?Physical Activity: Not on file  ?Stress: Not on file  ?Social Connections: Not on file  ? ?Past Surgical History:  ?  Procedure Laterality Date  ? WISDOM TOOTH EXTRACTION    ? ?Past Surgical History:  ?Procedure Laterality Date  ? WISDOM TOOTH EXTRACTION    ? ?Past Medical History:  ?Diagnosis Date  ? Back pain   ? Depression   ? Gall stones   ? Head injury   ? Neck pain   ? Suicidal ideation   ? ?BP 129/89   Pulse 90   Ht 6\' 1"  (1.854 m)   Wt (!) 362 lb (164.2 kg)   SpO2 97%   BMI 47.76 kg/m?  ? ?Opioid Risk Score:   ?Fall Risk Score:  `1 ? ?Depression screen PHQ 2/9 ? ? ?  12/12/2021  ?  2:00 PM 06/13/2021  ?  1:33 PM 04/09/2021  ?  1:23 PM 11/09/2020  ? 11:04 AM 10/12/2020  ? 11:46 AM 03/29/2020  ?  9:09 AM 03/01/2020  ?  9:38 AM  ?Depression screen PHQ 2/9  ?Decreased Interest 0 0 0 0 0 0 0  ?Down, Depressed, Hopeless 0 0 0 0 0 0 0  ?PHQ - 2 Score 0  0 0 0 0 0 0  ?  ? ?Review of Systems  ?Constitutional: Negative.   ?HENT: Negative.    ?Eyes: Negative.   ?Respiratory: Negative.    ?Cardiovascular: Negative.   ?Gastrointestinal: Negative.   ?Endocrine: Negative.   ?Genitourinary: Negative.   ?Musculoskeletal:  Positive for back pain.  ?Skin: Negative.   ?Allergic/Immunologic: Negative.   ?Neurological: Negative.   ?Hematological: Negative.   ?Psychiatric/Behavioral: Negative.    ? ?   ?Objective:  ? Physical Exam ?Vitals and nursing note reviewed.  ?Constitutional:   ?   Appearance: He is obese.  ?HENT:  ?   Head: Normocephalic and atraumatic.  ?Eyes:  ?   Extraocular Movements: Extraocular movements intact.  ?   Conjunctiva/sclera: Conjunctivae normal.  ?   Pupils: Pupils are equal, round, and reactive to light.  ?Musculoskeletal:  ?   Right lower leg: No edema.  ?   Left lower leg: No edema.  ?   Comments: Pain with lumbar extension relieved with lumbar flexion also pain with left-sided greater than right-sided lateral bending.  ?Skin: ?   General: Skin is warm and dry.  ?Neurological:  ?   Mental Status: He is alert and oriented to person, place, and time.  ?   Comments: Negative straight leg raising bilaterally ?Lower extremity strength is normal bilateral hip flexor knee extensor ankle dorsiflexor ?Ambulates without assistive device no evidence of toe drag or knee instability.  ?Psychiatric:     ?   Mood and Affect: Mood normal.     ?   Behavior: Behavior normal.  ? ? ? ? ? ?   ?Assessment & Plan:  ?1.  Lumbar spondylosis without myelopathy he is approximately 13 months since his last left-sided radiofrequency neurotomy and pain is starting to increase on that side.  We will schedule for repeat left L3-L4 medial branch L5 dorsal ramus radiofrequency neurotomy under fluoroscopic guidance next month. ?Given that the right-sided symptoms are increasing we will likely need to repeat the right lumbar RF the following month. ? ?

## 2022-01-30 ENCOUNTER — Encounter: Payer: Medicare Other | Attending: Physical Medicine & Rehabilitation | Admitting: Physical Medicine & Rehabilitation

## 2022-01-30 ENCOUNTER — Encounter: Payer: Self-pay | Admitting: Physical Medicine & Rehabilitation

## 2022-01-30 VITALS — BP 135/93 | HR 95 | Ht 73.0 in | Wt 397.0 lb

## 2022-01-30 DIAGNOSIS — M47817 Spondylosis without myelopathy or radiculopathy, lumbosacral region: Secondary | ICD-10-CM | POA: Insufficient documentation

## 2022-01-30 NOTE — Patient Instructions (Signed)
You had a radio frequency procedure today This was done to alleviate joint pain in your lumbar area We injected lidocaine which is a local anesthetic.  You may experience soreness at the injection sites. You may also experienced some irritation of the nerves that were heated I'm recommending ice for 30 minutes every 2 hours as needed for the next 24-48 hours   

## 2022-01-30 NOTE — Progress Notes (Signed)
  PROCEDURE RECORD [facility]   Name: Mark Rice DOB:1980-12-09 MRN: [id number]  Date:01/30/2022  Physician: Claudette Laws, MD    Nurse/CMA: Charise Carwin MA  Allergies:  Allergies  Allergen Reactions   Penicillins Anaphylaxis    Shortness of breath   Iohexol Itching    Patient's throat became extremely itchy after contrast administration. 50mg  Benadryl was administered. Patient's throat became extremely itchy after contrast administration. 50mg  Benadryl was administered. Patient's throat became extremely itchy after contrast administration. 50mg  Benadryl was administered.   Iodinated Contrast Media     Other reaction(s): Other (See Comments)    Consent Signed: Yes.    Is patient diabetic? Yes.  borderline  CBG today? 81  Pregnant: No. LMP: No LMP for male patient. (age 6-55)  Anticoagulants: no Anti-inflammatory: no Antibiotics: no  Procedure:  Left L3-4-5 Radiofeequency   Position: Prone Start Time: 2:32 pm  End Time: 2:49 pm  Fluoro Time: 51  RN/CMA , CMA Benjamin Casanas MA    Time 1:44 pm 2:56 pm    BP 135/93 134/87    Pulse 95 80    Respirations 16 16    O2 Sat 95 95    S/S 6 6    Pain Level 7/10 1/10     D/C home with wife, [name] & O X 3, D/C instructions reviewed, and sits independently.

## 2022-01-30 NOTE — Progress Notes (Signed)
Left L5 dorsal ramus., left L4 and left L3 medial branch radio frequency neurotomy under fluoroscopic guidance  Indication: Low back pain due to lumbar spondylosis which has been relieved on 2 occasions by greater than 50% by lumbar medial branch blocks at corresponding levels.  Informed consent was obtained after describing risks and benefits of the procedure with the patient, this includes bleeding, bruising, infection, paralysis and medication side effects. The patient wishes to proceed and has given written consent. The patient was placed in a prone position. The lumbar and sacral area was marked and prepped with Betadine. A 25-gauge 1-1/2 inch needle was inserted into the skin and subcutaneous tissue at 3 sites in one ML of 1% lidocaine was injected into each site. Then a 18-gauge 15 cm radio frequency needle with a 1 cm curved active tip was inserted targeting the left S1 SAP/sacral ala junction. Bone contact was made and confirmed with lateral imaging.  motor stimulation at 2 Hz confirm proper needle location followed by injection of one ML of 1% MPF lidocaine. Then the left L5 SAP/transverse process junction was targeted. Bone contact was made and confirmed with lateral imaging motor stimulation at 2 Hz confirm proper needle location followed by injection of one ML of the solution containing one ML of  1% MPF lidocaine. Then the left L4 SAP/transverse process junction was targeted. Bone contact was made and confirmed with lateral imaging. motor stimulation at 2 Hz confirm proper needle location followed by injection of one ML of the solution containing one ML of1% MPF lidocaine. Radio frequency lesion  at 80C for 90 seconds was performed. Needles were removed. Post procedure instructions and vital signs were performed. Patient tolerated procedure well. Followup appointment was given.  

## 2022-03-03 ENCOUNTER — Other Ambulatory Visit: Payer: Self-pay | Admitting: Physical Medicine & Rehabilitation

## 2022-03-17 NOTE — Telephone Encounter (Signed)
Patient is calling for a refill on Tramadol. Per PMP, last fill 12/17/21

## 2022-03-26 ENCOUNTER — Telehealth: Payer: Self-pay

## 2022-03-26 ENCOUNTER — Encounter: Payer: Self-pay | Admitting: Physical Medicine & Rehabilitation

## 2022-03-26 MED ORDER — DIAZEPAM 10 MG PO TABS: 10.0000 mg | ORAL_TABLET | Freq: Once | ORAL | 0 refills | Status: AC

## 2022-03-26 NOTE — Telephone Encounter (Signed)
Diazepam called to pharmacy 

## 2022-03-27 ENCOUNTER — Encounter: Payer: Self-pay | Admitting: Physical Medicine & Rehabilitation

## 2022-03-27 ENCOUNTER — Encounter: Payer: Medicare Other | Attending: Physical Medicine & Rehabilitation | Admitting: Physical Medicine & Rehabilitation

## 2022-03-27 VITALS — BP 107/79 | HR 86 | Temp 98.2°F | Resp 16 | Ht 73.0 in | Wt >= 6400 oz

## 2022-03-27 DIAGNOSIS — M47817 Spondylosis without myelopathy or radiculopathy, lumbosacral region: Secondary | ICD-10-CM | POA: Diagnosis present

## 2022-03-27 NOTE — Progress Notes (Signed)
RightL5 dorsal ramus., Right L4 and Right L3 medial branch radio frequency neurotomy under fluoroscopic guidance   Indication: Low back pain due to lumbar spondylosis which has been relieved on 2 occasions by greater than 50% by lumbar medial branch blocks at corresponding levels.  Informed consent was obtained after describing risks and benefits of the procedure with the patient, this includes bleeding, bruising, infection, paralysis and medication side effects. The patient wishes to proceed and has given written consent. The patient was placed in a prone position. The lumbar and sacral area was marked and prepped with Betadine. A 25-gauge 1-1/2 inch needle was inserted into the skin and subcutaneous tissue at 3 sites in one ML of 1% lidocaine was injected into each site. Then a 18-gauge 15 cm radio frequency needle with a 1 cm curved active tip was inserted targeting the Right S1 SAP/sacral ala junction. Bone contact was made and confirmed with lateral imaging.  motor stimulation at 2 Hz confirm proper needle location followed by injection of 1ml 2% MPF lidocaine. Then the Right L5 SAP/transverse process junction was targeted. Bone contact was made and confirmed with lateral imaging.  motor stimulation at 2 Hz confirm proper needle location followed by injection of 1ml 2% MPF lidocaine. Then the Right L4 SAP/transverse process junction was targeted. Bone contact was made and confirmed with lateral imaging. motor stimulation at 2 Hz confirm proper needle location followed by injection of 1ml 2% MPF lidocaine. Radio frequency lesion being at 80C for 90 seconds was performed. Needles were removed. Post procedure instructions and vital signs were performed. Patient tolerated procedure well. Followup appointment was given. 

## 2022-03-27 NOTE — Progress Notes (Signed)
  PROCEDURE RECORD [facility]   Name: Mark Rice DOB:January 25, 1981 MRN: [id number]  Date:03/27/2022  Physician: Claudette Laws, MD    Nurse/CMA: Charise Carwin MA  Allergies:  Allergies  Allergen Reactions   Penicillins Anaphylaxis    Shortness of breath   Iohexol Itching    Patient's throat became extremely itchy after contrast administration. 50mg  Benadryl was administered. Patient's throat became extremely itchy after contrast administration. 50mg  Benadryl was administered. Patient's throat became extremely itchy after contrast administration. 50mg  Benadryl was administered.   Iodinated Contrast Media     Other reaction(s): Other (See Comments)    Consent Signed: Yes.    Is patient diabetic? Yes.    CBG today? 78  Pregnant: No. LMP: No LMP for male patient. (age 60-55)  Anticoagulants: no Anti-inflammatory: no Antibiotics: no  Procedure:  Right 3-4-5 Radiofeequency  Position: Prone Start Time: 1:32 pm  End Time: 1:49pm  Fluoro Time: 46  RN/CMA Mellonie Guess MA Terrianna Holsclaw MA    Time 1:11 pm 1:53 pm    BP 107/79 118/83    Pulse 86 70    Respirations 16 16    O2 Sat 97 97    S/S 6 6    Pain Level 6/10 2/10     D/C home with Wife, [name] & O X 3, D/C instructions reviewed, and sits independently.

## 2022-03-27 NOTE — Patient Instructions (Signed)
You had a radio frequency procedure today This was done to alleviate joint pain in your lumbar area We injected lidocaine which is a local anesthetic.  You may experience soreness at the injection sites. You may also experienced some irritation of the nerves that were heated I'm recommending ice for 30 minutes every 2 hours as needed for the next 24-48 hours   

## 2022-05-27 ENCOUNTER — Encounter: Payer: Self-pay | Admitting: Physical Medicine & Rehabilitation

## 2022-06-06 ENCOUNTER — Encounter: Payer: Medicare Other | Attending: Physical Medicine & Rehabilitation | Admitting: Physical Medicine & Rehabilitation

## 2022-06-06 ENCOUNTER — Encounter: Payer: Self-pay | Admitting: Physical Medicine & Rehabilitation

## 2022-06-06 VITALS — BP 142/97 | HR 77 | Ht 73.0 in | Wt >= 6400 oz

## 2022-06-06 DIAGNOSIS — M47817 Spondylosis without myelopathy or radiculopathy, lumbosacral region: Secondary | ICD-10-CM | POA: Diagnosis present

## 2022-06-06 NOTE — Progress Notes (Signed)
Subjective:    Patient ID: Mark Rice, male    DOB: [birth year], 41 y.o.   MRN: [id number]  HPI 41 year old morbidly obese male with history of lumbar spondylosis causing facet mediated pain.  He has undergone radiofrequency neurotomy at the L3-4-5 levels but in the past has undergone radiofrequency at T12 L1-L2 levels.  This was last performed on the left side in August 2021. The patient states that he had insidious onset increased left upper lumbar pain.  He denies any falls or trauma that preceded this.  He has fallen once since but did not sustain any additional injury. The patient denies any pain radiating into his lower extremity.  No bowel or bladder dysfunction. Past medical history also significant for TBI but has recovered well from this  Pain Inventory Average Pain 6 Pain Right Now 6 My pain is constant, sharp, and aching  In the last 24 hours, has pain interfered with the following? General activity 5 Relation with others 5 Enjoyment of life 5 What TIME of day is your pain at its worst? morning  and night Sleep (in general) Poor  Pain is worse with: bending, standing, and some activites Pain improves with: medication Relief from Meds: 5  Family History  Problem Relation Age of Onset   Hypertension Mother    Spondylolysis Mother    Hypercholesterolemia Mother    Diabetes Father    Social History   Socioeconomic History   Marital status: Married    Spouse name: Not on file   Number of children: 4   Years of education: 14   Highest education level: Not on file  Occupational History   Occupation: Company secretary  Tobacco Use   Smoking status: Former    Packs/day: 0.25    Types: Cigarettes    Quit date: 06/14/2012    Years since quitting: 9.9   Smokeless tobacco: Never  Vaping Use   Vaping Use: Never used  Substance and Sexual Activity   Alcohol use: Not Currently    Alcohol/week: 0.0 standard drinks of alcohol    Comment: occ   Drug use: No    Sexual activity: Not on file  Other Topics Concern   Not on file  Social History Narrative   Lives at home with his wife.   Right-handed.   Occasional caffeine use.   Social Determinants of Health   Financial Resource Strain: Not on file  Food Insecurity: Not on file  Transportation Needs: Not on file  Physical Activity: Not on file  Stress: Not on file  Social Connections: Not on file   Past Surgical History:  Procedure Laterality Date   WISDOM TOOTH EXTRACTION     Past Surgical History:  Procedure Laterality Date   WISDOM TOOTH EXTRACTION     Past Medical History:  Diagnosis Date   Back pain    Depression    Gall stones    Head injury    Neck pain    Suicidal ideation    BP (!) 142/97   Pulse 77   Ht 6\' 1"  (1.854 m)   Wt (!) 413 lb 3.2 oz (187.4 kg)   SpO2 96%   BMI 54.52 kg/m   Opioid Risk Score:   Fall Risk Score:  `1  Depression screen PHQ 2/9     06/06/2022   11:44 AM 03/27/2022    1:08 PM 12/12/2021    2:00 PM 06/13/2021    1:33 PM 04/09/2021    1:23 PM 11/09/2020  11:04 AM 10/12/2020   11:46 AM  Depression screen PHQ 2/9  Decreased Interest 0 0 0 0 0 0 0  Down, Depressed, Hopeless 0 0 0 0 0 0 0  PHQ - 2 Score 0 0 0 0 0 0 0    Review of Systems  Constitutional: Negative.   HENT: Negative.    Eyes: Negative.   Respiratory: Negative.    Cardiovascular: Negative.   Gastrointestinal: Negative.   Endocrine: Negative.   Genitourinary: Negative.   Musculoskeletal:  Positive for back pain.  Skin: Negative.   Allergic/Immunologic: Negative.   Neurological: Negative.   Hematological: Negative.   Psychiatric/Behavioral: Negative.    All other systems reviewed and are negative.      Objective:   Physical Exam  Morbidly obese male no acute distress The patient has 50% lumbar flexion and 25% of normal lumbar extension.  Extension is accompanied by pain.  Left-sided bending is accompanied by pain but right-sided bending is not The patient has  tenderness palpation in the L1-L2-L3 paraspinal area on the left side only.  No pain in the lower lumbar region. Ambulates without assistive device no evidence of toe drag or knee instability Mood and affect are appropriate General no acute distress Negative straight leg raising bilaterally normal lower extremity strength      Assessment & Plan:   1.  Lumbar spondylosis without myelopathy.  His current complaints relate to the left upper lumbar levels.  It has been approximately 2 years since last radiofrequency and this is likely wearing off.  We will schedule for repeat.

## 2022-06-27 ENCOUNTER — Ambulatory Visit: Payer: Medicare Other | Admitting: Physical Medicine & Rehabilitation

## 2022-07-24 ENCOUNTER — Encounter: Payer: Self-pay | Admitting: Physical Medicine & Rehabilitation

## 2022-07-24 ENCOUNTER — Encounter: Payer: Medicare Other | Attending: Physical Medicine & Rehabilitation | Admitting: Physical Medicine & Rehabilitation

## 2022-07-24 VITALS — BP 134/90 | HR 97 | Temp 98.0°F | Ht 73.0 in | Wt >= 6400 oz

## 2022-07-24 DIAGNOSIS — M47817 Spondylosis without myelopathy or radiculopathy, lumbosacral region: Secondary | ICD-10-CM

## 2022-07-24 MED ORDER — LIDOCAINE HCL (PF) 2 % IJ SOLN
5.0000 mL | Freq: Once | INTRAMUSCULAR | Status: AC
  Administered 2022-07-24: 5 mL

## 2022-07-24 MED ORDER — TRAMADOL HCL 50 MG PO TABS: ORAL_TABLET | ORAL | 5 refills | Status: DC

## 2022-07-24 MED ORDER — LIDOCAINE HCL 1 % IJ SOLN
10.0000 mL | Freq: Once | INTRAMUSCULAR | Status: AC
  Administered 2022-07-24: 10 mL

## 2022-07-24 NOTE — Progress Notes (Signed)
Left T12,L1,L2 medial branch radio frequency neurotomy under fluoroscopic guidance    Indication: Low back pain due to lumbar spondylosis which has been relieved on 2 occasions by greater than 50% by lumbar medial branch blocks at corresponding levels.  Informed consent was obtained after describing risks and benefits of the procedure with the patient, this includes bleeding, bruising, infection, paralysis and medication side effects. The patient wishes to proceed and has given written consent. The patient was placed in a prone position. The lumbar and sacral area was marked and prepped with Betadine. A 25-gauge 1-1/2 inch needle was inserted into the skin and subcutaneous tissue at 3 sites in one ML of 1% lidocaine was injected into each site. Then a 18-gauge 15 cm radio frequency needle with a 1 cm curved active tip was inserted targeting the Left L3 SAP/transverse process junction. Bone contact was made and confirmed with lateral imaging.  motor stimulation at 2 Hz confirm proper needle location followed by injection of one ML of the solution  1% MPF lidocaine. Then the Left L2 SAP/transverse process junction was targeted. Bone contact was made and confirmed with lateral imaging.  motor stimulation at 2 Hz confirm proper needle location followed by injection of one ML of the solution containing  1% MPF lidocaine. Then the Left L1 SAP/transverse process junction was targeted. Bone contact was made and confirmed with lateral imaging. Motor stim at 2 Hz followed by injection of 63ml 1% MPF lidocaine. Radio frequency lesion being at [facility] for 90 seconds was performed. Needles were removed. Post procedure instructions and vital signs were performed. Patient tolerated procedure well. Followup appointment was given.Left L5 dorsal ramus., left L4 and left L3 medial branch radio frequency neurotomy under fluoroscopic guidance  Indication: Low back pain due to lumbar spondylosis which has been relieved on 2 occasions  by greater than 50% by lumbar medial branch blocks at corresponding levels.  Informed consent was obtained after describing risks and benefits of the procedure with the patient, this includes bleeding, bruising, infection, paralysis and medication side effects. The patient wishes to proceed and has given written consent. The patient was placed in a prone position. The lumbar and sacral area was marked and prepped with Betadine. A 25-gauge 1-1/2 inch needle was inserted into the skin and subcutaneous tissue at 3 sites in one ML of 1% lidocaine was injected into each site. Then a 18-gauge 15 cm radio frequency needle with a 1 cm curved active tip was inserted targeting the left S1 SAP/sacral ala junction. Bone contact was made and confirmed with lateral imaging.  motor stimulation at 2 Hz confirm proper needle location followed by injection of one ML of 2% MPF lidocaine. Then the left L5 SAP/transverse process junction was targeted. Bone contact was made and confirmed with lateral imaging motor stimulation at 2 Hz confirm proper needle location followed by injection of one ML of the solution containing one ML of  2% MPF lidocaine. Then the left L4 SAP/transverse process junction was targeted. Bone contact was made and confirmed with lateral imaging. motor stimulation at 2 Hz confirm proper needle location followed by injection of one ML of the solution containing one ML of2% MPF lidocaine. Radio frequency lesion  at [facility] for 90 seconds was performed. Needles were removed. Post procedure instructions and vital signs were performed. Patient tolerated procedure well. Followup appointment was given.

## 2022-07-24 NOTE — Progress Notes (Signed)
  PROCEDURE RECORD  Physical Medicine and Rehabilitation   Name: Sean Malinowski DOB:06-07-1981 MRN: [id number]  Date:07/24/2022  Physician: Claudette Laws, MD    Nurse/CMA: Charise Carwin MA  Allergies:  Allergies  Allergen Reactions   Penicillins Anaphylaxis    Shortness of breath   Iohexol Itching    Patient's throat became extremely itchy after contrast administration. 50mg  Benadryl was administered. Patient's throat became extremely itchy after contrast administration. 50mg  Benadryl was administered. Patient's throat became extremely itchy after contrast administration. 50mg  Benadryl was administered.   Iodinated Contrast Media     Other reaction(s): Other (See Comments)    Consent Signed: Yes.    Is patient diabetic? Yes.    CBG today? 82  Pregnant: No. LMP: No LMP for male patient. (age 68-55)  Anticoagulants: no Anti-inflammatory: no Antibiotics: no  Procedure:  L L3-4-5 Radiofeequency   Position: Prone Start Time: 1:18 pm  End Time: 84  Fluoro Time: 1:40 pm  RN/CMA MA Jansel Vonstein MA    Time 101 pm 1:46 pm    BP 134/90 115/75    Pulse 97 93    Respirations 16 16    O2 Sat 96 95    S/S 6 6    Pain Level 7/10 1/10     D/C home with Wife, [name] & O X 3, D/C instructions reviewed, and sits independently.

## 2022-07-24 NOTE — Patient Instructions (Signed)
You had a radio frequency procedure today This was done to alleviate joint pain in your lumbar area We injected lidocaine which is a local anesthetic.  You may experience soreness at the injection sites. You may also experienced some irritation of the nerves that were heated I'm recommending ice for 30 minutes every 2 hours as needed for the next 24-48 hours   

## 2022-10-23 ENCOUNTER — Encounter: Payer: Medicare Other | Attending: Physical Medicine & Rehabilitation | Admitting: Physical Medicine & Rehabilitation

## 2022-10-23 ENCOUNTER — Encounter: Payer: Self-pay | Admitting: Physical Medicine & Rehabilitation

## 2022-10-23 VITALS — BP 134/95 | HR 105 | Ht 73.0 in | Wt >= 6400 oz

## 2022-10-23 DIAGNOSIS — Z79891 Long term (current) use of opiate analgesic: Secondary | ICD-10-CM | POA: Diagnosis present

## 2022-10-23 DIAGNOSIS — Z5181 Encounter for therapeutic drug level monitoring: Secondary | ICD-10-CM | POA: Diagnosis present

## 2022-10-23 DIAGNOSIS — G894 Chronic pain syndrome: Secondary | ICD-10-CM | POA: Insufficient documentation

## 2022-10-23 NOTE — Patient Instructions (Signed)
Repeat RIght L3-4-5 RF

## 2022-10-23 NOTE — Progress Notes (Signed)
Subjective:    Patient ID: Mark Rice, male    DOB: [birth year], 42 y.o.   MRN: [id number] 07/24/2022: Left T12,L1,L2 medial branch radio frequency neurotomy under fluoroscopic guidance     03/27/2022: RightL5 dorsal ramus., Right L4 and Right L3 medial branch radio frequency neurotomy under fluoroscopic guidance      HPI  The patient returns today with primary complaint of right-sided low back pain.   Was evaluated by primary care on 10/15/2022 for acute right-sided thoracic pain.  He received a Toradol injection with good effect.  He indicates that his pain is around the waist and radiates into his right groin.  He has not had any falls or any other trauma.  He has had no loss of bowel or bladder or progressive leg weakness. He still takes tramadol 50 mg mainly at nighttime.  No side effects from this medication Pain Inventory Average Pain 6 Pain Right Now 6 My pain is stabbing and aching  In the last 24 hours, has pain interfered with the following? General activity 7 Relation with others 7 Enjoyment of life 7 What TIME of day is your pain at its worst? varies Sleep (in general) Poor  Pain is worse with: walking, bending, and standing Pain improves with: medication Relief from Meds: 7  Family History  Problem Relation Age of Onset   Hypertension Mother    Spondylolysis Mother    Hypercholesterolemia Mother    Diabetes Father    Social History   Socioeconomic History   Marital status: Married    Spouse name: Not on file   Number of children: 4   Years of education: 14   Highest education level: Not on file  Occupational History   Occupation: Biochemist, clinical  Tobacco Use   Smoking status: Former    Packs/day: 0.25    Types: Cigarettes    Quit date: 06/14/2012    Years since quitting: 10.3   Smokeless tobacco: Never  Vaping Use   Vaping Use: Never used  Substance and Sexual Activity   Alcohol use: Not Currently    Alcohol/week: 0.0 standard drinks of  alcohol    Comment: occ   Drug use: No   Sexual activity: Not on file  Other Topics Concern   Not on file  Social History Narrative   Lives at home with his wife.   Right-handed.   Occasional caffeine use.   Social Determinants of Health   Financial Resource Strain: Not on file  Food Insecurity: Not on file  Transportation Needs: Not on file  Physical Activity: Not on file  Stress: Not on file  Social Connections: Not on file   Past Surgical History:  Procedure Laterality Date   WISDOM TOOTH EXTRACTION     Past Surgical History:  Procedure Laterality Date   WISDOM TOOTH EXTRACTION     Past Medical History:  Diagnosis Date   Back pain    Depression    Gall stones    Head injury    Neck pain    Suicidal ideation    BP (!) 134/95   Pulse (!) 105   Ht '6\' 1"'$  (1.854 m)   Wt (!) 419 lb (190.1 kg)   SpO2 92%   BMI 55.28 kg/m   Opioid Risk Score:   Fall Risk Score:  `1  Depression screen [facility] 2/9     10/23/2022    3:35 PM 07/24/2022   12:58 PM 06/06/2022   11:44 AM 03/27/2022    1:08  PM 12/12/2021    2:00 PM 06/13/2021    1:33 PM 04/09/2021    1:23 PM  Depression screen PHQ 2/9  Decreased Interest 0 0 0 0 0 0 0  Down, Depressed, Hopeless 0 0 0 0 0 0 0  PHQ - 2 Score 0 0 0 0 0 0 0     Review of Systems  Musculoskeletal:  Positive for back pain.       Rt side  All other systems reviewed and are negative.      Objective:   Physical Exam  Morbidly obese male in no acute distress Mood and affect are appropriate Lumbar spine has mild to moderate tender to palp patient in the lumbar paraspinals around L4-L5 region.  There is pain with lumbar extension greater than with lumbar flexion.  He also has pain in that same area when leaning to the right side compared to the left side. Negative straight leg raise bilaterally Ambulates without assistive device no evidence of drag or knee instability.      Assessment & Plan:   1.  Lumbar spondylosis without  myelopathy.  He is approximately 7 months that is post right L3-L4-L5 medial branch radiofrequency neurotomy denervate L4-5 and L5-S1 levels.  His pain during corresponds with this region.  He has pain with extension as well as lateral bending. His radiofrequency is starting to wear off and we will schedule in 1 month procedure.  In the meantime we discussed his medication list including meloxicam which she states he is not taking.  He did get some relief with Toradol injection at the PCP office which we discussed with an anti-inflammatory.

## 2022-11-25 ENCOUNTER — Encounter: Payer: Self-pay | Admitting: Physical Medicine & Rehabilitation

## 2022-11-25 ENCOUNTER — Encounter: Payer: Medicare Other | Attending: Physical Medicine & Rehabilitation | Admitting: Physical Medicine & Rehabilitation

## 2022-11-25 VITALS — Temp 98.0°F

## 2022-11-25 DIAGNOSIS — Z79891 Long term (current) use of opiate analgesic: Secondary | ICD-10-CM

## 2022-11-25 DIAGNOSIS — Z5181 Encounter for therapeutic drug level monitoring: Secondary | ICD-10-CM | POA: Insufficient documentation

## 2022-11-25 DIAGNOSIS — M47817 Spondylosis without myelopathy or radiculopathy, lumbosacral region: Secondary | ICD-10-CM | POA: Diagnosis not present

## 2022-11-25 DIAGNOSIS — G894 Chronic pain syndrome: Secondary | ICD-10-CM

## 2022-11-25 MED ORDER — LIDOCAINE HCL 1 % IJ SOLN
10.0000 mL | Freq: Once | INTRAMUSCULAR | Status: AC
Start: 2022-11-25 — End: 2022-11-25
  Administered 2022-11-25: 10 mL

## 2022-11-25 MED ORDER — LIDOCAINE HCL (PF) 2 % IJ SOLN
5.0000 mL | Freq: Once | INTRAMUSCULAR | Status: AC
Start: 2022-11-25 — End: 2022-11-25
  Administered 2022-11-25: 5 mL

## 2022-11-25 NOTE — Progress Notes (Signed)
  PROCEDURE RECORD [facility]   Name: Mark Rice DOB:Sep 11, 1980 MRN: [id number]  Date:11/25/2022  Physician: Alysia Penna, MD    Nurse/CMA: Hanad Leino S  Allergies:  Allergies  Allergen Reactions   Penicillins Anaphylaxis    Shortness of breath   Iohexol Itching    Patient's throat became extremely itchy after contrast administration. 50mg  Benadryl was administered. Patient's throat became extremely itchy after contrast administration. 50mg  Benadryl was administered. Patient's throat became extremely itchy after contrast administration. 50mg  Benadryl was administered.   Iodinated Contrast Media     Other reaction(s): Other (See Comments)    Consent Signed: Yes.    Is patient diabetic? Yes.    CBG today? 54  Pregnant: No. LMP: No LMP for male patient. (age 51-55)  Anticoagulants: no Anti-inflammatory: no Antibiotics: no  Procedure: Right L3-L4-L5 Radiofrequency  Position: Prone Start Time: 1:36  End Time: 1:51  Fluoro Time: 1.02  RN/CMA Romario Tith S Avant Printy S    Time 1:18 1:57    BP 137/95 143/95    Pulse 92 90    Respirations 16 16    O2 Sat 93 94    S/S 6 1    Pain Level 7/10 1/10     D/C home with spouse, [name] & O X 3, D/C instructions reviewed, and sits independently.

## 2022-11-25 NOTE — Progress Notes (Signed)
RightL5 dorsal ramus., Right L4 and Right L3 medial branch radio frequency neurotomy under fluoroscopic guidance   Indication: Low back pain due to lumbar spondylosis which has been relieved on 2 occasions by greater than 50% by lumbar medial branch blocks at corresponding levels.  Informed consent was obtained after describing risks and benefits of the procedure with the patient, this includes bleeding, bruising, infection, paralysis and medication side effects. The patient wishes to proceed and has given written consent. The patient was placed in a prone position. The lumbar and sacral area was marked and prepped with Betadine. A 25-gauge 1-1/2 inch needle was inserted into the skin and subcutaneous tissue at 3 sites in one ML of 1% lidocaine was injected into each site. Then a 18-gauge 15 cm radio frequency needle with a 1 cm curved active tip was inserted targeting the Right S1 SAP/sacral ala junction. Bone contact was made and confirmed with lateral imaging.  motor stimulation at 2 Hz confirm proper needle location followed by injection of 1ml 2% MPF lidocaine. Then the Right L5 SAP/transverse process junction was targeted. Bone contact was made and confirmed with lateral imaging.  motor stimulation at 2 Hz confirm proper needle location followed by injection of 1ml 2% MPF lidocaine. Then the Right L4 SAP/transverse process junction was targeted. Bone contact was made and confirmed with lateral imaging. motor stimulation at 2 Hz confirm proper needle location followed by injection of 1ml 2% MPF lidocaine. Radio frequency lesion being at 80C for 90 seconds was performed. Needles were removed. Post procedure instructions and vital signs were performed. Patient tolerated procedure well. Followup appointment was given. 

## 2022-11-25 NOTE — Patient Instructions (Addendum)
You had a radio frequency procedure today This was done to alleviate joint pain in your lumbar area We injected lidocaine which is a local anesthetic.  You may experience soreness at the injection sites. You may also experienced some irritation of the nerves that were heated I'm recommending ice for 30 minutes every 2 hours as needed for the next 24-48 hours    Need urine screen today

## 2022-11-28 LAB — TOXASSURE SELECT,+ANTIDEPR,UR

## 2022-12-10 ENCOUNTER — Telehealth: Payer: Self-pay | Admitting: *Deleted

## 2022-12-10 NOTE — Telephone Encounter (Signed)
Urine drug screen positive for prescribed cyclobenzaprine.

## 2023-04-01 ENCOUNTER — Other Ambulatory Visit: Payer: Self-pay | Admitting: Physical Medicine & Rehabilitation

## 2023-05-26 ENCOUNTER — Encounter: Payer: Self-pay | Admitting: Physical Medicine & Rehabilitation

## 2023-05-26 ENCOUNTER — Encounter: Payer: Medicare Other | Attending: Physical Medicine & Rehabilitation | Admitting: Physical Medicine & Rehabilitation

## 2023-05-26 VITALS — BP 141/88 | HR 105 | Ht 73.0 in | Wt 390.0 lb

## 2023-05-26 DIAGNOSIS — M47817 Spondylosis without myelopathy or radiculopathy, lumbosacral region: Secondary | ICD-10-CM

## 2023-05-26 NOTE — Progress Notes (Signed)
Subjective:    Patient ID: Mark Rice, male    DOB: [birth year], 42 y.o.   MRN: [id number]  HPI 42 year old male with history of lumbar spondylosis without myelopathy.  He has a history of morbid obesity with BMI of 51.  He has lost 30 pounds however.  He is in a weight loss program consisting on dietary modification as well as exercise. >50% relief of low back pain on left side until mid Sept 2024, T12,L1, L2 RFA on 07/25/23 Right sided low back pain continues to do well his last right L3-L4 medial branch L5 dorsal ramus radiofrequency neurotomy was performed 11/25/2022. Pain Inventory Average Pain 7 Pain Right Now 7 My pain is stabbing and aching  In the last 24 hours, has pain interfered with the following? General activity 6 Relation with others 6 Enjoyment of life 6 What TIME of day is your pain at its worst? morning  and night Sleep (in general) Good  Pain is worse with: bending, sitting, standing, and some activites Pain improves with: rest and medication Relief from Meds: 6  Family History  Problem Relation Age of Onset   Hypertension Mother    Spondylolysis Mother    Hypercholesterolemia Mother    Diabetes Father    Social History   Socioeconomic History   Marital status: Married    Spouse name: Not on file   Number of children: 4   Years of education: 14   Highest education level: Not on file  Occupational History   Occupation: Company secretary  Tobacco Use   Smoking status: Former    Current packs/day: 0.00    Types: Cigarettes    Quit date: 06/14/2012    Years since quitting: 10.9   Smokeless tobacco: Never  Vaping Use   Vaping status: Never Used  Substance and Sexual Activity   Alcohol use: Not Currently    Alcohol/week: 0.0 standard drinks of alcohol    Comment: occ   Drug use: No   Sexual activity: Not on file  Other Topics Concern   Not on file  Social History Narrative   Lives at home with his wife.   Right-handed.   Occasional caffeine  use.   Social Determinants of Health   Financial Resource Strain: Not on file  Food Insecurity: Low Risk  (03/11/2023)   Received from [facility]   Hunger Vital Sign    Worried About Running Out of Food in the Last Year: Never true    Ran Out of Food in the Last Year: Never true  Transportation Needs: No Transportation Needs (03/11/2023)   Received from [facility]    In the past 12 months, has lack of reliable transportation kept you from medical appointments, meetings, work or from getting things needed for daily living? : No  Physical Activity: Not on file  Stress: Not on file  Social Connections: Not on file   Past Surgical History:  Procedure Laterality Date   WISDOM TOOTH EXTRACTION     Past Surgical History:  Procedure Laterality Date   WISDOM TOOTH EXTRACTION     Past Medical History:  Diagnosis Date   Back pain    Depression    Gall stones    Head injury    Neck pain    Suicidal ideation    BP (!) 141/88   Pulse (!) 105   Ht 6\' 1"  (1.854 m)   Wt (!) 390 lb (176.9 kg)   SpO2 94%   BMI  51.45 kg/m   Opioid Risk Score:   Fall Risk Score:  `1  Depression screen [facility] 2/9     05/26/2023   12:59 PM 10/23/2022    3:35 PM 07/24/2022   12:58 PM 06/06/2022   11:44 AM 03/27/2022    1:08 PM 12/12/2021    2:00 PM 06/13/2021    1:33 PM  Depression screen PHQ 2/9  Decreased Interest 0 0 0 0 0 0 0  Down, Depressed, Hopeless 0 0 0 0 0 0 0  PHQ - 2 Score 0 0 0 0 0 0 0      Review of Systems  Musculoskeletal:  Positive for back pain and gait problem.  All other systems reviewed and are negative.     Objective:   Physical Exam General No acute distress Mood and affect appropriate Lumbar spine has tenderness to palpation left upper lumbar area paraspinal.  Pain increases with extension as well as leftward bending Negative straight leg raising bilaterally Motor strength is 5/5 bilateral lower extremities Ambulates without assist device no  evidence of toe drag or knee instability Lumbar spine range of motion 50% flexion as well as extension.       Assessment & Plan:   1.  Lumbar spondylosis he has upper lumbar spondylosis on the left causing pain he has had greater than 50% relief of his pain for greater than 6 months after radiofrequency neurotomy.  We will schedule for repeat left T12 L1-L2 medial branch radiofrequency neurotomy In regards to his right lower lumbar pain he has continued good success with greater than 50% relief of right-sided lower lumbar approximately 6 months post right L3-L4 medial branch and right L5 dorsal ramus radiofrequency neurotomy under fluoroscopic guidance Patient no longer using tramadol.  Continue with as needed Tylenol.

## 2023-06-29 ENCOUNTER — Telehealth: Payer: Self-pay

## 2023-06-29 MED ORDER — DIAZEPAM 10 MG PO TABS: 10.0000 mg | ORAL_TABLET | Freq: Once | ORAL | 0 refills | Status: AC

## 2023-06-29 NOTE — Telephone Encounter (Signed)
Called in valium for procedure

## 2023-06-30 ENCOUNTER — Telehealth: Payer: Self-pay

## 2023-06-30 ENCOUNTER — Encounter: Payer: Medicare Other | Admitting: Physical Medicine & Rehabilitation

## 2023-06-30 MED ORDER — TRAMADOL HCL 50 MG PO TABS: ORAL_TABLET | ORAL | 5 refills | Status: DC

## 2023-06-30 NOTE — Telephone Encounter (Signed)
Mark Rice was rescheduled today. He is requesting a refill of Tramadol.  Call back phone [phone].   Filled  Written  ID  Drug  QTY  Days  Prescriber  RX #  Dispenser  Refill  Daily Dose*  Pymt Type  PMP  01/04/2023 07/24/2022 1  Tramadol Hcl 50 Mg Tablet 30.00 30 An Kir [id number] [facility] (1748) 5/5 10.00 MME Medicare [place] 11/26/2022 07/24/2022 1  Tramadol Hcl 50 Mg Tablet 30.00 30 An Kir [id number] [facility] (1748) 4/5 10.00 MME Medicare [place] 10/24/2022 07/24/2022 1  Tramadol Hcl 50 Mg Tablet 30.00 30 An Kir [id number] [facility] (1748) 3/5 10.00 MME Medicare [place]

## 2023-07-10 ENCOUNTER — Ambulatory Visit: Payer: Medicare Other | Admitting: Physical Medicine & Rehabilitation

## 2023-08-14 ENCOUNTER — Encounter: Payer: Self-pay | Admitting: Physical Medicine & Rehabilitation

## 2023-08-14 ENCOUNTER — Encounter: Payer: Medicare Other | Attending: Physical Medicine & Rehabilitation | Admitting: Physical Medicine & Rehabilitation

## 2023-08-14 VITALS — BP 131/90 | HR 96 | Temp 98.2°F | Ht 73.0 in | Wt 392.0 lb

## 2023-08-14 DIAGNOSIS — M47817 Spondylosis without myelopathy or radiculopathy, lumbosacral region: Secondary | ICD-10-CM | POA: Insufficient documentation

## 2023-08-14 NOTE — Progress Notes (Signed)
Left T12,L1,L2 medial branch radio frequency neurotomy under fluoroscopic guidance    Indication: Low back pain due to lumbar spondylosis which has been relieved on 2 occasions by greater than 50% by lumbar medial branch blocks at corresponding levels.  Informed consent was obtained after describing risks and benefits of the procedure with the patient, this includes bleeding, bruising, infection, paralysis and medication side effects. The patient wishes to proceed and has given written consent. The patient was placed in a prone position. The lumbar and sacral area was marked and prepped with Betadine. A 25-gauge 1-1/2 inch needle was inserted into the skin and subcutaneous tissue at 3 sites in one ML of 1% lidocaine was injected into each site. Then a 18-gauge 15 cm radio frequency needle with a 1 cm curved active tip was inserted targeting the Left L3 SAP/transverse process junction. Bone contact was made and confirmed with lateral imaging.  motor stimulation at 2 Hz confirm proper needle location followed by injection of one ML of the solution  1% MPF lidocaine. Then the Left L2 SAP/transverse process junction was targeted. Bone contact was made and confirmed with lateral imaging.  motor stimulation at 2 Hz confirm proper needle location followed by injection of one ML of the solution containing  1% MPF lidocaine. Then the Left L1 SAP/transverse process junction was targeted. Bone contact was made and confirmed with lateral imaging. Motor stim at 2 Hz followed by injection of 1ml 1% MPF lidocaine. Radio frequency lesion being at 80C for 90 seconds was performed. Needles were removed. Post procedure instructions and vital signs were performed. Patient tolerated procedure well. Followup appointment was given.Left L5 dorsal ramus., left L4 and left L3 medial branch radio frequency neurotomy under fluoroscopic guidance  Indication: Low back pain due to lumbar spondylosis which has been relieved on 2 occasions  by greater than 50% by lumbar medial branch blocks at corresponding levels.  Informed consent was obtained after describing risks and benefits of the procedure with the patient, this includes bleeding, bruising, infection, paralysis and medication side effects. The patient wishes to proceed and has given written consent. The patient was placed in a prone position. The lumbar and sacral area was marked and prepped with Betadine. A 25-gauge 1-1/2 inch needle was inserted into the skin and subcutaneous tissue at 3 sites in one ML of 1% lidocaine was injected into each site. Then a 18-gauge 15 cm radio frequency needle with a 1 cm curved active tip was inserted targeting the left S1 SAP/sacral ala junction. Bone contact was made and confirmed with lateral imaging.  motor stimulation at 2 Hz confirm proper needle location followed by injection of one ML of 2% MPF lidocaine. Then the left L5 SAP/transverse process junction was targeted. Bone contact was made and confirmed with lateral imaging motor stimulation at 2 Hz confirm proper needle location followed by injection of one ML of the solution containing one ML of  2% MPF lidocaine. Then the left L4 SAP/transverse process junction was targeted. Bone contact was made and confirmed with lateral imaging. motor stimulation at 2 Hz confirm proper needle location followed by injection of one ML of the solution containing one ML of2% MPF lidocaine. Radio frequency lesion  at 80C for 90 seconds was performed. Needles were removed. Post procedure instructions and vital signs were performed. Patient tolerated procedure well. Followup appointment was given.  

## 2023-08-14 NOTE — Progress Notes (Signed)
  PROCEDURE RECORD [facility]   Name: Mark Rice DOB:October 18, 1980 MRN: [id number]  Date:08/14/2023  Physician: Claudette Laws, MD    Nurse/CMA: Nedra Hai, CMA  Allergies:  Allergies  Allergen Reactions   Penicillins Anaphylaxis    Shortness of breath   Iodinated Contrast Media Itching    Other reaction(s): Other (See Comments)  Other Reaction(s): Other (See Comments)  Patient's throat became extremely itchy after contrast administration. 50mg  Benadryl was administered., Patient's throat became extremely itchy after contrast administration. 50mg  Benadryl was administered.   Iohexol Itching    Patient's throat became extremely itchy after contrast administration. 50mg  Benadryl was administered. Patient's throat became extremely itchy after contrast administration. 50mg  Benadryl was administered. Patient's throat became extremely itchy after contrast administration. 50mg  Benadryl was administered.    Consent Signed: Yes.    Is patient diabetic? Yes.    CBG today? 87  Pregnant: No. LMP: No LMP for male patient. (age 34-55)  Anticoagulants: no Anti-inflammatory: no Antibiotics: no  Procedure: Left T12, L1, L2 Radiofrequency  Position: Prone Start Time: 12:59 pm  End Time: 1:15 pm  Fluoro Time: 52  RN/CMA Nedra Hai, CMA Suriyah Vergara, CMA    Time 12:45 pm 1:24 pm    BP 131/90 136/88    Pulse 96 89    Respirations 16 16    O2 Sat 94 95    S/S 6 6    Pain Level 5/10 2/10     D/C home with wife, [name] & O X 3, D/C instructions reviewed, and sits independently.

## 2023-08-14 NOTE — Patient Instructions (Signed)
You had a radio frequency procedure today This was done to alleviate joint pain in your lumbar area We injected lidocaine which is a local anesthetic.  You may experience soreness at the injection sites. You may also experienced some irritation of the nerves that were heated I'm recommending ice for 30 minutes every 2 hours as needed for the next 24-48 hours   

## 2023-08-17 DIAGNOSIS — M47817 Spondylosis without myelopathy or radiculopathy, lumbosacral region: Secondary | ICD-10-CM | POA: Diagnosis not present

## 2023-08-17 MED ORDER — LIDOCAINE HCL 1 % IJ SOLN
10.0000 mL | Freq: Once | INTRAMUSCULAR | Status: AC
  Administered 2023-08-17: 10 mL

## 2023-08-17 MED ORDER — LIDOCAINE HCL (PF) 2 % IJ SOLN
5.0000 mL | Freq: Once | INTRAMUSCULAR | Status: AC
  Administered 2023-08-17: 5 mL

## 2023-08-17 NOTE — Addendum Note (Signed)
Addended by: Silas [place] T on: 08/17/2023 10:42 AM   Modules accepted: Orders

## 2023-08-20 ENCOUNTER — Encounter: Payer: Medicare Other | Admitting: Physical Medicine & Rehabilitation

## 2023-09-25 ENCOUNTER — Encounter: Payer: Self-pay | Admitting: Physical Medicine & Rehabilitation

## 2023-09-25 ENCOUNTER — Encounter: Payer: Medicare Other | Attending: Physical Medicine & Rehabilitation | Admitting: Physical Medicine & Rehabilitation

## 2023-09-25 VITALS — BP 119/75 | HR 96 | Ht 73.0 in | Wt >= 6400 oz

## 2023-09-25 DIAGNOSIS — M47817 Spondylosis without myelopathy or radiculopathy, lumbosacral region: Secondary | ICD-10-CM | POA: Insufficient documentation

## 2023-09-25 NOTE — Progress Notes (Signed)
Subjective:    Patient ID: Mark Rice, male    DOB: 12/28/1980, 43 y.o.   MRN: [id number]  HPI  43 year old male with history of morbid obesity, traumatic brain injury, lumbar spondylosis without myelopathy.  He has had very good results with radiofrequency neurotomies.  His treatment sites include the left T12 L1-L2 medial branches as well as the right L3-L4 medial branches L5 dorsal ramus. He does not have any left-sided low back pain. Has gained weight, some stress related eating as well as the holidays No falls or new traumatic injuries. RIght Lumbar pain increasing for the last 1 mo. Right L3-4-5 RF last performed November 25, 2022 Pain Inventory Average Pain 6 Pain Right Now 7 My pain is constant, sharp, burning, dull, stabbing, tingling, and aching  In the last 24 hours, has pain interfered with the following? General activity 0 Relation with others 0 Enjoyment of life 7 What TIME of day is your pain at its worst? morning  Sleep (in general) Poor  Pain is worse with: walking, bending, sitting, standing, and some activites Pain improves with: medication and TENS Relief from Meds: 6  Family History  Problem Relation Age of Onset   Hypertension Mother    Spondylolysis Mother    Hypercholesterolemia Mother    Diabetes Father    Social History   Socioeconomic History   Marital status: Married    Spouse name: Not on file   Number of children: 4   Years of education: 14   Highest education level: Not on file  Occupational History   Occupation: Company secretary  Tobacco Use   Smoking status: Former    Current packs/day: 0.00    Types: Cigarettes    Quit date: 06/14/2012    Years since quitting: 11.2   Smokeless tobacco: Never  Vaping Use   Vaping status: Never Used  Substance and Sexual Activity   Alcohol use: Not Currently    Alcohol/week: 0.0 standard drinks of alcohol    Comment: occ   Drug use: No   Sexual activity: Not on file  Other Topics Concern    Not on file  Social History Narrative   Lives at home with his wife.   Right-handed.   Occasional caffeine use.   Social Drivers of Corporate investment banker Strain: Not on file  Food Insecurity: Low Risk  (03/11/2023)   Received from [facility]   Hunger Vital Sign    Worried About Running Out of Food in the Last Year: Never true    Ran Out of Food in the Last Year: Never true  Transportation Needs: No Transportation Needs (03/11/2023)   Received from [facility]    In the past 12 months, has lack of reliable transportation kept you from medical appointments, meetings, work or from getting things needed for daily living? : No  Physical Activity: Not on file  Stress: Not on file  Social Connections: Not on file   Past Surgical History:  Procedure Laterality Date   WISDOM TOOTH EXTRACTION     Past Surgical History:  Procedure Laterality Date   WISDOM TOOTH EXTRACTION     Past Medical History:  Diagnosis Date   Back pain    Depression    Gall stones    Head injury    Neck pain    Suicidal ideation    BP 119/75   Pulse 96   Ht 6\' 1"  (1.854 m)   Wt (!) 413 lb (187.3  kg)   SpO2 95%   BMI 54.49 kg/m   Opioid Risk Score:   Fall Risk Score:  `1  Depression screen [facility] 2/9     09/25/2023   10:47 AM 05/26/2023   12:59 PM 10/23/2022    3:35 PM 07/24/2022   12:58 PM 06/06/2022   11:44 AM 03/27/2022    1:08 PM 12/12/2021    2:00 PM  Depression screen PHQ 2/9  Decreased Interest 1 0 0 0 0 0 0  Down, Depressed, Hopeless 1 0 0 0 0 0 0  PHQ - 2 Score 2 0 0 0 0 0 0    Review of Systems  Musculoskeletal:  Positive for back pain.  All other systems reviewed and are negative.      Objective:   Physical Exam Morbidly obese No acute distress Mood and affect appropriate Lumbar spine tenderness palpation right L3-L4-L5 paraspinal area Negative straight leg raising bilateral Normal strength bilateral lower extremities Ambulates without assist  device no evidence toe drag or knee instability Lumbar spine range of motion 50% lumbar flexion and 25% lumbar extension.       Assessment & Plan:  1.  Lumbar spondylosis without myelopathy affecting right L4-5 L5-S1 facet levels corresponding to L3 and L4 medial branches as well as L5 dorsal ramus.  He is now 9 months post radiofrequency neurotomy of these nerves.  He has had greater than 50% relief for greater than 6 months.  As expected it is starting to wear off.  Will repeat next month. The patient also has L1-L2 3 mediated facet joint pain on the left side which is currently well-controlled status post T12 L1-L2 medial branch radiofrequency neurotomies performed on 08/14/2023

## 2023-10-28 NOTE — Progress Notes (Signed)
  PROCEDURE RECORD [facility]   Name: Mark Rice DOB:1980-11-24 MRN: [id number]  Date:10/28/2023  Physician: Claudette Laws, MD    Nurse/CMA: Charise Carwin MA  Allergies:    Established Patient Office Visit  Subjective   Patient ID: Mark Rice, male    DOB: 09/01/1980  Age: 43 y.o. MRN: [id number]  Chief Complaint  Patient presents with   Procedure    R L3-4-5 Radiofeequency     HPI    ROS    Objective:     BP 126/85   Pulse 93   Temp 98.1 F (36.7 C)   Ht 6\' 1"  (1.854 m)   Wt (!) 411 lb (186.4 kg)   SpO2 95%   BMI 54.22 kg/m    Physical Exam   No results found for any visits on 10/30/23.    The 10-year ASCVD risk score (Arnett DK, et al., 2019) is: 10.2%    Assessment & Plan:   Problem List Items Addressed This Visit     Lumbosacral spondylosis without myelopathy - Primary   Relevant Medications   lidocaine (XYLOCAINE) 1 % (with pres) injection 10 mL   lidocaine HCl (PF) (XYLOCAINE) 2 % injection 5 mL    Return in about 6 months (around 05/01/2024) for MD.    Charise Carwin, RMA   Consent Signed: Yes.    Is patient diabetic? Yes.    CBG today? 89  Pregnant: N/A LMP: No LMP for male patient. (age 74-55)  Anticoagulants: no Anti-inflammatory: no Antibiotics: no  Procedure:  R L3-4-5 Radiofeequency  Position: Prone Start Time: 1:24 PM  End Time: 1:35 PM  Fluoro Time: 35  RN/CMA Yousof Alderman MA Ananiah Maciolek MA    Time 1:14 PM 1:54 PM    BP 126/85 134/88    Pulse 96 98    Respirations 16 16    O2 Sat 95 98    S/S 6 6    Pain Level 8/10 1/10     D/C home with Wife, [name] & O X 3, D/C instructions reviewed, and sits independently.

## 2023-10-30 ENCOUNTER — Encounter: Payer: Self-pay | Admitting: Physical Medicine & Rehabilitation

## 2023-10-30 ENCOUNTER — Encounter: Payer: Medicare Other | Attending: Physical Medicine & Rehabilitation | Admitting: Physical Medicine & Rehabilitation

## 2023-10-30 VITALS — BP 126/85 | HR 93 | Temp 98.1°F | Ht 73.0 in | Wt >= 6400 oz

## 2023-10-30 DIAGNOSIS — M47817 Spondylosis without myelopathy or radiculopathy, lumbosacral region: Secondary | ICD-10-CM | POA: Diagnosis present

## 2023-10-30 MED ORDER — LIDOCAINE HCL (PF) 2 % IJ SOLN
5.0000 mL | Freq: Once | INTRAMUSCULAR | Status: AC
  Administered 2023-10-30: 5 mL

## 2023-10-30 MED ORDER — LIDOCAINE HCL 1 % IJ SOLN
10.0000 mL | Freq: Once | INTRAMUSCULAR | Status: AC
  Administered 2023-10-30: 10 mL

## 2023-10-30 NOTE — Progress Notes (Signed)
RightL5 dorsal ramus., Right L4 and Right L3 medial branch radio frequency neurotomy under fluoroscopic guidance   Indication: Low back pain due to lumbar spondylosis which has been relieved on 2 occasions by greater than 50% by lumbar medial branch blocks at corresponding levels.  Informed consent was obtained after describing risks and benefits of the procedure with the patient, this includes bleeding, bruising, infection, paralysis and medication side effects. The patient wishes to proceed and has given written consent. The patient was placed in a prone position. The lumbar and sacral area was marked and prepped with Betadine. A 25-gauge 1-1/2 inch needle was inserted into the skin and subcutaneous tissue at 3 sites in one ML of 1% lidocaine was injected into each site. Then a 18-gauge 15 cm radio frequency needle with a 1 cm curved active tip was inserted targeting the Right S1 SAP/sacral ala junction. Bone contact was made and confirmed with lateral imaging.  motor stimulation at 2 Hz confirm proper needle location followed by injection of 1ml 2% MPF lidocaine. Then the Right L5 SAP/transverse process junction was targeted. Bone contact was made and confirmed with lateral imaging.  motor stimulation at 2 Hz confirm proper needle location followed by injection of 1ml 2% MPF lidocaine. Then the Right L4 SAP/transverse process junction was targeted. Bone contact was made and confirmed with lateral imaging. motor stimulation at 2 Hz confirm proper needle location followed by injection of 1ml 2% MPF lidocaine. Radio frequency lesion being at [facility] for 90 seconds was performed. Needles were removed. Post procedure instructions and vital signs were performed. Patient tolerated procedure well. Followup appointment was given.

## 2023-12-04 ENCOUNTER — Other Ambulatory Visit: Payer: Self-pay | Admitting: Physical Medicine & Rehabilitation

## 2024-01-25 ENCOUNTER — Encounter: Payer: Self-pay | Admitting: Physical Medicine & Rehabilitation

## 2024-04-29 ENCOUNTER — Encounter: Payer: Self-pay | Admitting: Physical Medicine & Rehabilitation

## 2024-04-29 ENCOUNTER — Encounter: Attending: Physical Medicine & Rehabilitation | Admitting: Physical Medicine & Rehabilitation

## 2024-04-29 VITALS — BP 122/86 | HR 105 | Ht 73.0 in | Wt 394.0 lb

## 2024-04-29 DIAGNOSIS — Z79891 Long term (current) use of opiate analgesic: Secondary | ICD-10-CM | POA: Insufficient documentation

## 2024-04-29 DIAGNOSIS — G894 Chronic pain syndrome: Secondary | ICD-10-CM | POA: Diagnosis present

## 2024-04-29 DIAGNOSIS — Z5181 Encounter for therapeutic drug level monitoring: Secondary | ICD-10-CM | POA: Insufficient documentation

## 2024-04-29 NOTE — Progress Notes (Signed)
 Subjective:    Patient ID: Mark Rice, male    DOB: 10/10/1980, 43 y.o.   MRN: [id number]  HPI CC:  Left sided low back pain   43 year old male with history of lumbar spondylosis.  He has had treatment of bilateral lower lumbar pain with radiofrequency neurotomy as well as left upper lumbar pain with radiofrequency neurotomy.  He has not had good results with that.  His upper lumbar pain is still doing well. Left lower lumbar pain started worsening after he started playing golf again. The patient denies pain going down his leg.  There is no significant right-sided lumbar pain He takes tramadol  50 mg only on bad days  Left T12, L1, L2 RFA 08/17/24   11/09/2020 Left L5 dorsal ramus., left L4 and left L3 medial branch radio frequency neurotomy under fluoroscopic guidance  Pain Inventory Average Pain 7 Pain Right Now 7 My pain is sharp, stabbing, tingling, and aching  In the last 24 hours, has pain interfered with the following? General activity 8 Relation with others 8 Enjoyment of life 8 What TIME of day is your pain at its worst? morning  and night Sleep (in general) Fair  Pain is worse with: sitting, standing, and some activites Pain improves with: rest and medication Relief from Meds: 8  Family History  Problem Relation Age of Onset   Hypertension Mother    Spondylolysis Mother    Hypercholesterolemia Mother    Diabetes Father    Social History   Socioeconomic History   Marital status: Married    Spouse name: Not on file   Number of children: 4   Years of education: 14   Highest education level: Not on file  Occupational History   Occupation: Company secretary  Tobacco Use   Smoking status: Former    Current packs/day: 0.00    Types: Cigarettes    Quit date: 06/14/2012    Years since quitting: 11.8   Smokeless tobacco: Never  Vaping Use   Vaping status: Never Used  Substance and Sexual Activity   Alcohol use: Not Currently    Alcohol/week: 0.0  standard drinks of alcohol    Comment: occ   Drug use: No   Sexual activity: Not on file  Other Topics Concern   Not on file  Social History Narrative   Lives at home with his wife.   Right-handed.   Occasional caffeine use.   Social Drivers of Corporate investment banker Strain: Not on file  Food Insecurity: Low Risk  (03/11/2023)   Received from [facility]   Hunger Vital Sign    Within the past 12 months, you worried that your food would run out before you got money to buy more: Never true    Within the past 12 months, the food you bought just didn't last and you didn't have money to get more. : Never true  Transportation Needs: No Transportation Needs (03/11/2023)   Received from [facility]    In the past 12 months, has lack of reliable transportation kept you from medical appointments, meetings, work or from getting things needed for daily living? : No  Physical Activity: Not on file  Stress: Not on file  Social Connections: Not on file   Past Surgical History:  Procedure Laterality Date   WISDOM TOOTH EXTRACTION     Past Surgical History:  Procedure Laterality Date   WISDOM TOOTH EXTRACTION     Past Medical History:  Diagnosis Date  Back pain    Depression    Gall stones    Head injury    Neck pain    Suicidal ideation    BP 122/86   Pulse (!) 105   Ht 6' 1 (1.854 m)   Wt (!) 394 lb (178.7 kg)   SpO2 96%   BMI 51.98 kg/m   Opioid Risk Score:   Fall Risk Score:  `1  Depression screen PHQ 2/9     10/30/2023    1:12 PM 09/25/2023   10:47 AM 05/26/2023   12:59 PM 10/23/2022    3:35 PM 07/24/2022   12:58 PM 06/06/2022   11:44 AM 03/27/2022    1:08 PM  Depression screen PHQ 2/9  Decreased Interest 1 1 0 0 0 0 0  Down, Depressed, Hopeless 1 1 0 0 0 0 0  PHQ - 2 Score 2 2 0 0 0 0 0      Review of Systems     Objective:   Physical Exam General No acute distress Mood and affect appropriate Extremities without edema He has  tenderness palpation lower lumbar area on the left side no tenderness above L4 on the left side Lower extremity strength is normal Ambulates without assistive device no evidence toe drag or instability With extension greater than flexion.  Pain with left lateral bending greater than right lateral bending.      Assessment & Plan:  #1.  Left lower lumbar pain without sciatica.  He has done well with this pain since 2023 but has had recurrence since he started golfing again.  He stated that he is no longer golfing since the pain has recurred. Will schedule for repeat left L3-4 medial branch and left L5 dorsal ramus radiofrequency neurotomy.

## 2024-05-04 LAB — TOXASSURE SELECT,+ANTIDEPR,UR

## 2024-05-24 ENCOUNTER — Ambulatory Visit: Payer: Self-pay | Admitting: *Deleted

## 2024-06-10 ENCOUNTER — Encounter: Payer: Self-pay | Admitting: Physical Medicine & Rehabilitation

## 2024-06-10 ENCOUNTER — Encounter: Attending: Physical Medicine & Rehabilitation | Admitting: Physical Medicine & Rehabilitation

## 2024-06-10 VITALS — BP 117/82 | HR 106 | Ht 73.0 in | Wt >= 6400 oz

## 2024-06-10 DIAGNOSIS — M47817 Spondylosis without myelopathy or radiculopathy, lumbosacral region: Secondary | ICD-10-CM

## 2024-06-10 MED ORDER — LIDOCAINE HCL (PF) 2 % IJ SOLN
4.0000 mL | Freq: Once | INTRAMUSCULAR | Status: AC
  Administered 2024-06-10: 4 mL

## 2024-06-10 MED ORDER — LIDOCAINE HCL 1 % IJ SOLN
10.0000 mL | Freq: Once | INTRAMUSCULAR | Status: AC
  Administered 2024-06-10: 10 mL

## 2024-06-10 NOTE — Progress Notes (Addendum)
Left L5 dorsal ramus., left L4 and left L3 medial branch radio frequency neurotomy under fluoroscopic guidance  Indication: Low back pain due to lumbar spondylosis which has been relieved on 2 occasions by greater than 50% by lumbar medial branch blocks at corresponding levels.  Informed consent was obtained after describing risks and benefits of the procedure with the patient, this includes bleeding, bruising, infection, paralysis and medication side effects. The patient wishes to proceed and has given written consent. The patient was placed in a prone position. The lumbar and sacral area was marked and prepped with Betadine. A 25-gauge 1-1/2 inch needle was inserted into the skin and subcutaneous tissue at 3 sites in one ML of 1% lidocaine was injected into each site. Then a 18-gauge 15 cm radio frequency needle with a 1 cm curved active tip was inserted targeting the left S1 SAP/sacral ala junction. Bone contact was made and confirmed with lateral imaging.  motor stimulation at 2 Hz confirm proper needle location followed by injection of one ML of 2% MPF lidocaine. Then the left L5 SAP/transverse process junction was targeted. Bone contact was made and confirmed with lateral imaging motor stimulation at 2 Hz confirm proper needle location followed by injection of one ML of the solution containing one ML of  2% MPF lidocaine. Then the left L4 SAP/transverse process junction was targeted. Bone contact was made and confirmed with lateral imaging. motor stimulation at 2 Hz confirm proper needle location followed by injection of one ML of the solution containing one ML of2% MPF lidocaine. Radio frequency lesion  at [facility] for 90 seconds was performed. Needles were removed. Post procedure instructions and vital signs were performed. Patient tolerated procedure well. Followup appointment was given.

## 2024-06-10 NOTE — Progress Notes (Signed)
  PROCEDURE RECORD [facility]   Name: Mark Rice DOB:1981/02/21 MRN: [id number]  Date:06/10/2024  Physician: Prentice Compton, MD    Nurse/CMA: Jameyah Fennewald RN  Allergies:  Allergies  Allergen Reactions   Penicillins Anaphylaxis    Shortness of breath   Iodinated Contrast Media Itching    Other reaction(s): Other (See Comments)  Other Reaction(s): Other (See Comments)  Patient's throat became extremely itchy after contrast administration. 50mg  Benadryl  was administered., Patient's throat became extremely itchy after contrast administration. 50mg  Benadryl  was administered.   Iohexol  Itching    Patient's throat became extremely itchy after contrast administration. 50mg  Benadryl  was administered. Patient's throat became extremely itchy after contrast administration. 50mg  Benadryl  was administered. Patient's throat became extremely itchy after contrast administration. 50mg  Benadryl  was administered.    Consent Signed: Yes.    Is patient diabetic? Yes.    CBG today? 83  Pregnant: No. LMP: No LMP for male patient. (age 20-55)  Anticoagulants: no Anti-inflammatory: no Antibiotics: no  Procedure: L3-4 medial branch  L5 dorsal ramus radiofrequency  Position: Prone Start Time: 3:15  End Time: 3:28  Fluoro Time: 41 sec  RN/CMA Haematologist RN    Time 3:02 330    BP 117/82 127/84    Pulse 106 97    Respirations 16 16    O2 Sat 95 97    S/S 6 6    Pain Level 7-8/10 -     D/C home with wife, [name] & O X 3, D/C instructions reviewed, and sits independently.         Subjective:    Patient ID: Mark Rice, male    DOB: 08/12/1981, 43 y.o.   MRN: [id number]  HPI    Review of Systems     Objective:   Physical Exam        Assessment & Plan:

## 2024-06-10 NOTE — Patient Instructions (Signed)
 You had a radio frequency procedure today This was done to alleviate joint pain in your lumbar area We injected lidocaine  which is a local anesthetic.  You may experience soreness at the injection sites. You may also experienced some irritation of the nerves that were heated I'm recommending ice for 30 minutes every 2 hours as needed for the next 24-48 hours  Left L3-4-5

## 2024-06-10 NOTE — Addendum Note (Signed)
 Addended by: Crimson Beer W on: 06/10/2024 04:41 PM   Modules accepted: Orders

## 2024-06-10 NOTE — Addendum Note (Signed)
 Addended by: CARILYN PRENTICE BRAVO on: 06/10/2024 04:22 PM   Modules accepted: Orders

## 2024-08-21 ENCOUNTER — Other Ambulatory Visit: Payer: Self-pay | Admitting: Physical Medicine & Rehabilitation

## 2024-08-31 ENCOUNTER — Telehealth: Payer: Self-pay

## 2024-08-31 NOTE — Telephone Encounter (Signed)
 [facility] is processing your PA request for Tramadol  (cover my med).

## 2024-12-09 ENCOUNTER — Encounter: Admitting: Physical Medicine & Rehabilitation
